# Patient Record
Sex: Female | Born: 1965 | Race: Black or African American | Hispanic: No | Marital: Single | Smoking: Current every day smoker
Health system: Southern US, Community
[De-identification: ages and names within clinical notes are randomized; demographics above are authoritative.]

## PROBLEM LIST (undated history)

## (undated) DIAGNOSIS — I209 Angina pectoris, unspecified: Secondary | ICD-10-CM

## (undated) DIAGNOSIS — R0602 Shortness of breath: Secondary | ICD-10-CM

## (undated) DIAGNOSIS — O2230 Deep phlebothrombosis in pregnancy, unspecified trimester: Secondary | ICD-10-CM

## (undated) DIAGNOSIS — F419 Anxiety disorder, unspecified: Secondary | ICD-10-CM

## (undated) HISTORY — PX: TUBAL LIGATION: SHX77

---

## 2010-10-06 ENCOUNTER — Inpatient Hospital Stay (INDEPENDENT_AMBULATORY_CARE_PROVIDER_SITE_OTHER)
Admission: RE | Admit: 2010-10-06 | Discharge: 2010-10-06 | Disposition: A | Discharge status: Home / Self Care | Payer: Self-pay | Source: Ambulatory Visit | Attending: Emergency Medicine | Admitting: Emergency Medicine

## 2010-10-06 DIAGNOSIS — K029 Dental caries, unspecified: Secondary | ICD-10-CM

## 2010-10-29 ENCOUNTER — Other Ambulatory Visit (HOSPITAL_COMMUNITY): Payer: Self-pay | Admitting: Family Medicine

## 2010-10-29 DIAGNOSIS — Z1231 Encounter for screening mammogram for malignant neoplasm of breast: Secondary | ICD-10-CM

## 2010-10-30 ENCOUNTER — Ambulatory Visit (HOSPITAL_COMMUNITY)
Admission: RE | Admit: 2010-10-30 | Discharge: 2010-10-30 | Disposition: A | Discharge status: Home / Self Care | Payer: Self-pay | Source: Ambulatory Visit | Attending: Family Medicine | Admitting: Family Medicine

## 2010-10-30 DIAGNOSIS — Z1231 Encounter for screening mammogram for malignant neoplasm of breast: Secondary | ICD-10-CM | POA: Insufficient documentation

## 2011-06-05 ENCOUNTER — Emergency Department (HOSPITAL_COMMUNITY): Payer: Self-pay

## 2011-06-05 ENCOUNTER — Encounter: Payer: Self-pay | Admitting: Emergency Medicine

## 2011-06-05 ENCOUNTER — Other Ambulatory Visit: Payer: Self-pay

## 2011-06-05 ENCOUNTER — Inpatient Hospital Stay (HOSPITAL_COMMUNITY)
Admission: EM | Admit: 2011-06-05 | Discharge: 2011-06-08 | DRG: 313 | Disposition: A | Discharge status: Home / Self Care | Payer: Self-pay | Attending: Internal Medicine | Admitting: Internal Medicine

## 2011-06-05 DIAGNOSIS — E876 Hypokalemia: Secondary | ICD-10-CM

## 2011-06-05 DIAGNOSIS — R079 Chest pain, unspecified: Secondary | ICD-10-CM

## 2011-06-05 DIAGNOSIS — R42 Dizziness and giddiness: Secondary | ICD-10-CM

## 2011-06-05 DIAGNOSIS — F17200 Nicotine dependence, unspecified, uncomplicated: Secondary | ICD-10-CM | POA: Diagnosis present

## 2011-06-05 DIAGNOSIS — R0789 Other chest pain: Principal | ICD-10-CM

## 2011-06-05 DIAGNOSIS — R110 Nausea: Secondary | ICD-10-CM

## 2011-06-05 HISTORY — DX: Angina pectoris, unspecified: I20.9

## 2011-06-05 HISTORY — DX: Shortness of breath: R06.02

## 2011-06-05 HISTORY — DX: Anxiety disorder, unspecified: F41.9

## 2011-06-05 HISTORY — DX: Deep phlebothrombosis in pregnancy, unspecified trimester: O22.30

## 2011-06-05 LAB — CBC
HCT: 36.5 % (ref 36.0–46.0)
Hemoglobin: 12.5 g/dL (ref 12.0–15.0)
MCH: 30.4 pg (ref 26.0–34.0)
MCHC: 34.2 g/dL (ref 30.0–36.0)
RDW: 14.1 % (ref 11.5–15.5)

## 2011-06-05 LAB — COMPREHENSIVE METABOLIC PANEL
Albumin: 3.7 g/dL (ref 3.5–5.2)
BUN: 7 mg/dL (ref 6–23)
Creatinine, Ser: 0.69 mg/dL (ref 0.50–1.10)
Total Protein: 7.1 g/dL (ref 6.0–8.3)

## 2011-06-05 LAB — DIFFERENTIAL
Basophils Absolute: 0 10*3/uL (ref 0.0–0.1)
Basophils Relative: 0 % (ref 0–1)
Eosinophils Absolute: 0.1 10*3/uL (ref 0.0–0.7)
Monocytes Absolute: 0.4 10*3/uL (ref 0.1–1.0)
Monocytes Relative: 8 % (ref 3–12)
Neutrophils Relative %: 46 % (ref 43–77)

## 2011-06-05 LAB — POCT I-STAT TROPONIN I: Troponin i, poc: 0 ng/mL (ref 0.00–0.08)

## 2011-06-05 MED ORDER — HYDROMORPHONE HCL PF 1 MG/ML IJ SOLN
1.0000 mg | Freq: Once | INTRAMUSCULAR | Status: AC
  Administered 2011-06-05: 1 mg via INTRAVENOUS
  Filled 2011-06-05: qty 1

## 2011-06-05 MED ORDER — HYDROCODONE-ACETAMINOPHEN 5-325 MG PO TABS: 1.0000 | ORAL_TABLET | Freq: Four times a day (QID) | ORAL | Status: DC | PRN

## 2011-06-05 NOTE — ED Notes (Signed)
Pt states pain has eased up but she is dizzy.  States she hasn't eaten or had anything to drink in 24 hrs.  EDP will see pt shortly-pt aware.  Will ask if ok for pt to eat/drink.

## 2011-06-05 NOTE — ED Notes (Signed)
Patient is resting comfortably. States pain is nearly gone but now feels sleepy and dizzy from medication.  Has call bell. Lights off for comfort.  VSS.

## 2011-06-05 NOTE — ED Notes (Signed)
Patient transported to X-ray 

## 2011-06-05 NOTE — ED Notes (Signed)
Pt c/o chest heaviness.  States feels like someone is sitting on her chest but says it isn't new.  New BP 102/76 taken and monitor showing SR 70's.  Will cont to monitor.

## 2011-06-05 NOTE — ED Provider Notes (Addendum)
History     CSN: [id number] Arrival date & time: 06/05/2011  3:40 PM   First MD Initiated Contact with Patient 06/05/11 1622      Chief Complaint  Patient presents with  . Chest Pain    (Consider location/radiation/quality/duration/timing/severity/associated sxs/prior treatment) Patient is a 45 y.o. female presenting with chest pain. The history is provided by the patient (Patient complains of chest pain worse with movement no nausea no sweating no shortness of shortness of).  Chest Pain The chest pain began 2 days ago. Chest pain occurs frequently. The chest pain is unchanged. At its most intense, the pain is at 3/10. The pain is currently at 2/10. The severity of the pain is moderate. The quality of the pain is described as aching. The pain does not radiate. Pertinent negatives for primary symptoms include no fever, no fatigue, no cough and no abdominal pain.  Pertinent negatives for associated symptoms include no claudication.  Pertinent negatives for past medical history include no seizures.     Past Medical History  Diagnosis Date  . DVT (deep vein thrombosis) in pregnancy     Past Surgical History  Procedure Date  . Tubal ligation     History reviewed. No pertinent family history.  History  Substance Use Topics  . Smoking status: Current Everyday Smoker -- 0.5 packs/day  . Smokeless tobacco: Not on file  . Alcohol Use: Yes     ocasionally    OB History    Grav Para Term Preterm Abortions TAB SAB Ect Mult Living                  Review of Systems  Constitutional: Negative for fever and fatigue.  HENT: Negative for congestion, sinus pressure and ear discharge.   Eyes: Negative for discharge.  Respiratory: Negative for cough.   Cardiovascular: Positive for chest pain. Negative for claudication.  Gastrointestinal: Negative for abdominal pain and diarrhea.  Genitourinary: Negative for frequency and hematuria.  Musculoskeletal: Negative for back pain.  Skin:  Negative for rash.  Neurological: Negative for seizures and headaches.  Hematological: Negative.   Psychiatric/Behavioral: Negative for hallucinations.    Allergies  Review of patient's allergies indicates no known allergies.  Home Medications   Current Outpatient Rx  Name Route Sig Dispense Refill  . IBUPROFEN 200 MG PO TABS Oral Take 400 mg by mouth every 6 (six) hours as needed. For pain.     Marland Kitchen HYDROCODONE-ACETAMINOPHEN 5-325 MG PO TABS Oral Take 1 tablet by mouth every 6 (six) hours as needed for pain. 20 tablet 0    BP 96/67  Pulse 63  Temp(Src) 98.6 F (37 C) (Oral)  Resp 17  SpO2 100%  LMP 05/26/2011  Physical Exam  Constitutional: She is oriented to person, place, and time. She appears well-developed.  HENT:  Head: Normocephalic and atraumatic.  Eyes: Conjunctivae and EOM are normal. No scleral icterus.  Neck: Neck supple. No thyromegaly present.  Cardiovascular: Normal rate and regular rhythm.  Exam reveals no gallop and no friction rub.   No murmur heard. Pulmonary/Chest: No stridor. She has no wheezes. She has no rales. She exhibits no tenderness.       tendernous over sternum  Abdominal: She exhibits no distension. There is no tenderness. There is no rebound.  Musculoskeletal: Normal range of motion. She exhibits no edema.  Lymphadenopathy:    She has no cervical adenopathy.  Neurological: She is oriented to person, place, and time. Coordination normal.  Skin: No rash noted.  No erythema.  Psychiatric: She has a normal mood and affect. Her behavior is normal.    ED Course  Procedures (including critical care time)   Labs Reviewed  CBC  DIFFERENTIAL  COMPREHENSIVE METABOLIC PANEL  POCT I-STAT TROPONIN I  I-STAT TROPONIN I   Dg Chest 2 View  06/05/2011  *RADIOLOGY REPORT*  Clinical Data: Shortness of breath and chest pain, smoker  CHEST - 2 VIEW  Comparison:  None.  Findings:  The heart size and mediastinal contours are within normal limits.  Both  lungs are clear.  The visualized skeletal structures are unremarkable.  IMPRESSION: No active cardiopulmonary disease.  Original Report Authenticated By: Harrel Lemon, M.D.     1. Chest wall pain    Results for orders placed during the hospital encounter of 06/05/11  CBC      Component Value Range   WBC 4.9  4.0 - 10.5 (K/uL)   RBC 4.11  3.87 - 5.11 (MIL/uL)   Hemoglobin 12.5  12.0 - 15.0 (g/dL)   HCT 14.7  82.9 - 56.2 (%)   MCV 88.8  78.0 - 100.0 (fL)   MCH 30.4  26.0 - 34.0 (pg)   MCHC 34.2  30.0 - 36.0 (g/dL)   RDW 13.0  86.5 - 78.4 (%)   Platelets 260  150 - 400 (K/uL)  DIFFERENTIAL      Component Value Range   Neutrophils Relative 46  43 - 77 (%)   Neutro Abs 2.3  1.7 - 7.7 (K/uL)   Lymphocytes Relative 43  12 - 46 (%)   Lymphs Abs 2.1  0.7 - 4.0 (K/uL)   Monocytes Relative 8  3 - 12 (%)   Monocytes Absolute 0.4  0.1 - 1.0 (K/uL)   Eosinophils Relative 3  0 - 5 (%)   Eosinophils Absolute 0.1  0.0 - 0.7 (K/uL)   Basophils Relative 0  0 - 1 (%)   Basophils Absolute 0.0  0.0 - 0.1 (K/uL)  COMPREHENSIVE METABOLIC PANEL      Component Value Range   Sodium 137  135 - 145 (mEq/L)   Potassium 3.9  3.5 - 5.1 (mEq/L)   Chloride 102  96 - 112 (mEq/L)   CO2 28  19 - 32 (mEq/L)   Glucose, Bld 87  70 - 99 (mg/dL)   BUN 7  6 - 23 (mg/dL)   Creatinine, Ser 6.96  0.50 - 1.10 (mg/dL)   Calcium 9.2  8.4 - 29.5 (mg/dL)   Total Protein 7.1  6.0 - 8.3 (g/dL)   Albumin 3.7  3.5 - 5.2 (g/dL)   AST 15  0 - 37 (U/L)   ALT 9  0 - 35 (U/L)   Alkaline Phosphatase 73  39 - 117 (U/L)   Total Bilirubin 0.3  0.3 - 1.2 (mg/dL)   GFR calc non Af Amer >90  >90 (mL/min)   GFR calc Af Amer >90  >90 (mL/min)  POCT I-STAT TROPONIN I      Component Value Range   Troponin i, poc 0.00  0.00 - 0.08 (ng/mL)   Comment 3             Date: 06/05/2011  Rate55  Rhythm: sinus bradycardia  QRS Axis: normal  Intervals: normal  ST/T Wave abnormalities: normal  Conduction Disutrbances:none  Narrative  Interpretation:   Old EKG Reviewed: none available  Results for orders placed during the hospital encounter of 06/05/11  CBC      Component Value Range  WBC 4.9  4.0 - 10.5 (K/uL)   RBC 4.11  3.87 - 5.11 (MIL/uL)   Hemoglobin 12.5  12.0 - 15.0 (g/dL)   HCT 40.9  81.1 - 91.4 (%)   MCV 88.8  78.0 - 100.0 (fL)   MCH 30.4  26.0 - 34.0 (pg)   MCHC 34.2  30.0 - 36.0 (g/dL)   RDW 78.2  95.6 - 21.3 (%)   Platelets 260  150 - 400 (K/uL)  DIFFERENTIAL      Component Value Range   Neutrophils Relative 46  43 - 77 (%)   Neutro Abs 2.3  1.7 - 7.7 (K/uL)   Lymphocytes Relative 43  12 - 46 (%)   Lymphs Abs 2.1  0.7 - 4.0 (K/uL)   Monocytes Relative 8  3 - 12 (%)   Monocytes Absolute 0.4  0.1 - 1.0 (K/uL)   Eosinophils Relative 3  0 - 5 (%)   Eosinophils Absolute 0.1  0.0 - 0.7 (K/uL)   Basophils Relative 0  0 - 1 (%)   Basophils Absolute 0.0  0.0 - 0.1 (K/uL)  COMPREHENSIVE METABOLIC PANEL      Component Value Range   Sodium 137  135 - 145 (mEq/L)   Potassium 3.9  3.5 - 5.1 (mEq/L)   Chloride 102  96 - 112 (mEq/L)   CO2 28  19 - 32 (mEq/L)   Glucose, Bld 87  70 - 99 (mg/dL)   BUN 7  6 - 23 (mg/dL)   Creatinine, Ser 0.86  0.50 - 1.10 (mg/dL)   Calcium 9.2  8.4 - 57.8 (mg/dL)   Total Protein 7.1  6.0 - 8.3 (g/dL)   Albumin 3.7  3.5 - 5.2 (g/dL)   AST 15  0 - 37 (U/L)   ALT 9  0 - 35 (U/L)   Alkaline Phosphatase 73  39 - 117 (U/L)   Total Bilirubin 0.3  0.3 - 1.2 (mg/dL)   GFR calc non Af Amer >90  >90 (mL/min)   GFR calc Af Amer >90  >90 (mL/min)  POCT I-STAT TROPONIN I      Component Value Range   Troponin i, poc 0.00  0.00 - 0.08 (ng/mL)   Comment 3            Dg Chest 2 View  06/05/2011  *RADIOLOGY REPORT*  Clinical Data: Shortness of breath and chest pain, smoker  CHEST - 2 VIEW  Comparison:  None.  Findings:  The heart size and mediastinal contours are within normal limits.  Both lungs are clear.  The visualized skeletal structures are unremarkable.  IMPRESSION: No active  cardiopulmonary disease.  Original Report Authenticated By: Harrel Lemon, M.D.       MDM  Pt with chest wall pain,  Normal troponin and ekg    Pt did not want to go home with the chest wall pain,  She was admitted for further work up    Benny Lennert, MD 06/05/11 2007  Benny Lennert, MD 06/06/11 [id number]

## 2011-06-05 NOTE — ED Notes (Signed)
Pt given sandwich and apple juice as requested and ok by EDP.  Pt otherwise in nad.

## 2011-06-05 NOTE — ED Notes (Signed)
Pt reports having left sided chest pain that feels like a "vice" intermittently started x2 days ago sometimes radiating into left shoulder and neck. Reports she did have some N/V and sob, but not at this time.

## 2011-06-05 NOTE — ED Notes (Signed)
Pt presenting to ed with c/o chest pain x 2 days with shortness of breath and nausea no vomiting. Pt states pain is intermittent. Pt is alert and oriented at this time.

## 2011-06-05 NOTE — ED Notes (Signed)
Phlebotomy notified of need for istat troponin

## 2011-06-05 NOTE — ED Notes (Signed)
Pt medicated as ordered.  Per EDP, pt not up for d/c yet.  Will reassess after pain meds for the disposition.

## 2011-06-06 ENCOUNTER — Observation Stay (HOSPITAL_COMMUNITY): Payer: Self-pay

## 2011-06-06 ENCOUNTER — Encounter (HOSPITAL_COMMUNITY): Payer: Self-pay | Admitting: *Deleted

## 2011-06-06 ENCOUNTER — Other Ambulatory Visit: Payer: Self-pay

## 2011-06-06 DIAGNOSIS — R079 Chest pain, unspecified: Secondary | ICD-10-CM | POA: Diagnosis present

## 2011-06-06 DIAGNOSIS — E876 Hypokalemia: Secondary | ICD-10-CM | POA: Diagnosis not present

## 2011-06-06 DIAGNOSIS — R110 Nausea: Secondary | ICD-10-CM | POA: Diagnosis present

## 2011-06-06 LAB — CARDIAC PANEL(CRET KIN+CKTOT+MB+TROPI)
CK, MB: 5.2 ng/mL — ABNORMAL HIGH (ref 0.3–4.0)
CK, MB: 4.8 ng/mL — ABNORMAL HIGH (ref 0.3–4.0)
Total CK: 133 U/L (ref 7–177)
Troponin I: <0.3 ng/mL (ref <0.30)

## 2011-06-06 LAB — CBC
HCT: 34.8 % — ABNORMAL LOW (ref 36.0–46.0)
Hemoglobin: 11.5 g/dL — ABNORMAL LOW (ref 12.0–15.0)
RBC: 3.9 MIL/uL (ref 3.87–5.11)
RDW: 13.9 % (ref 11.5–15.5)
WBC: 4.8 10*3/uL (ref 4.0–10.5)

## 2011-06-06 LAB — BASIC METABOLIC PANEL
Chloride: 106 mEq/L (ref 96–112)
GFR calc Af Amer: >90 mL/min (ref >90)
Potassium: 3.4 mEq/L — ABNORMAL LOW (ref 3.5–5.1)
Sodium: 138 mEq/L (ref 135–145)

## 2011-06-06 MED ORDER — ENOXAPARIN SODIUM 40 MG/0.4ML ~~LOC~~ SOLN
40.0000 mg | SUBCUTANEOUS | Status: DC
  Administered 2011-06-06 – 2011-06-08 (×3): 40 mg via SUBCUTANEOUS
  Filled 2011-06-06 (×3): qty 0.4

## 2011-06-06 MED ORDER — ASPIRIN EC 325 MG PO TBEC
325.0000 mg | DELAYED_RELEASE_TABLET | Freq: Every day | ORAL | Status: DC
  Administered 2011-06-06 – 2011-06-08 (×3): 325 mg via ORAL
  Filled 2011-06-06 (×3): qty 1

## 2011-06-06 MED ORDER — PANTOPRAZOLE SODIUM 40 MG PO TBEC
40.0000 mg | DELAYED_RELEASE_TABLET | Freq: Every day | ORAL | Status: DC
  Administered 2011-06-06 – 2011-06-08 (×3): 40 mg via ORAL
  Filled 2011-06-06 (×4): qty 1

## 2011-06-06 MED ORDER — IOHEXOL 300 MG/ML  SOLN
100.0000 mL | Freq: Once | INTRAMUSCULAR | Status: AC | PRN
  Administered 2011-06-06: 100 mL via INTRAVENOUS

## 2011-06-06 MED ORDER — SODIUM CHLORIDE 0.9 % IJ SOLN
3.0000 mL | Freq: Two times a day (BID) | INTRAMUSCULAR | Status: DC
  Administered 2011-06-06 – 2011-06-07 (×4): 3 mL via INTRAVENOUS

## 2011-06-06 MED ORDER — MORPHINE SULFATE 2 MG/ML IJ SOLN: 2.0000 mg | INTRAMUSCULAR | Status: DC | PRN

## 2011-06-06 MED ORDER — POTASSIUM CHLORIDE CRYS ER 20 MEQ PO TBCR
40.0000 meq | EXTENDED_RELEASE_TABLET | Freq: Once | ORAL | Status: AC
  Administered 2011-06-06: 40 meq via ORAL
  Filled 2011-06-06: qty 2

## 2011-06-06 MED ORDER — SODIUM CHLORIDE 0.9 % IJ SOLN: 3.0000 mL | INTRAMUSCULAR | Status: DC | PRN

## 2011-06-06 MED ORDER — SODIUM CHLORIDE 0.9 % IV SOLN: 250.0000 mL | INTRAVENOUS | Status: DC

## 2011-06-06 MED ORDER — LORAZEPAM 1 MG PO TABS: 1.0000 mg | ORAL_TABLET | Freq: Three times a day (TID) | ORAL | Status: DC | PRN

## 2011-06-06 MED ORDER — ALUM & MAG HYDROXIDE-SIMETH 200-200-20 MG/5ML PO SUSP: 30.0000 mL | Freq: Four times a day (QID) | ORAL | Status: DC | PRN

## 2011-06-06 NOTE — Consult Note (Signed)
CARDIOLOGY CONSULT NOTE  Patient ID: Cynthia Carr MRN: [id number] DOB/AGE: Nov 24, 1965 45 y.o.  Admit date: 06/05/2011 Referring Physician Dr. Olena Leatherwood Primary Physician N/A Primary CardiologistN/A Reason for Consultation chest pain  HPI: Cynthia Carr is a 45 year old African American female who is seen at the request of the hospitalist service for evaluation of chest pain. She has previously been healthy. She reports that since Wednesday of this week she has experienced symptoms of chest pain. This pain began at rest. She describes it as her entire upper body being in a vice. There is an occasional sharp component and squeezing component. It is relieved by lying down in a quiet position and regulating her breathing. It is worse with certain positions. It is not consistently worse with exertion. She has associated nausea and dizziness. She complains of tingling in her hands. Pain may last anywhere from 5 minutes to 2 hours. There are no clear alleviating factors. She did receive an injection of IV Dilaudid and reports that this relieved her pain for several hours. She's never had this pain previously. She has no history of hypertension, hyperlipidemia, or diabetes. She does smoke 5-6 cigarettes per day. There is no family history of premature coronary disease. She is usually active and has noticed no difficulty with walking previously.  Review of systems complete and found to be negative unless listed above   Past Medical History  Diagnosis Date  . DVT (deep vein thrombosis) in pregnancy   . Angina   . Shortness of breath   . Anxiety     History reviewed. No pertinent family history.  History   Social History  . Marital Status: Single    Spouse Name: N/A    Number of Children: N/A  . Years of Education: N/A   Occupational History  . Not on file.   Social History Main Topics  . Smoking status: Current Everyday Smoker -- 0.5 packs/day for 20 years  . Smokeless tobacco: Not on file  .  Alcohol Use: Yes     ocasionally  . Drug Use: No  . Sexually Active: Not Currently   Other Topics Concern  . Not on file   Social History Narrative  . No narrative on file    Past Surgical History  Procedure Date  . Tubal ligation      Prescriptions prior to admission  Medication Sig Dispense Refill  . ibuprofen (ADVIL,MOTRIN) 200 MG tablet Take 400 mg by mouth every 6 (six) hours as needed. For pain.         Physical Exam: Blood pressure 97/64, pulse 59, temperature 98.2 F (36.8 C), temperature source Oral, resp. rate 16, height 5\' 3"  (1.6 m), weight 86.5 kg (190 lb 11.2 oz), last menstrual period 05/26/2011, SpO2 100.00%.   She is a pleasant, overweight, black female in no acute distress. She does to appear to be somewhat uncomfortable.The patient is alert and oriented x 3.  The mood and affect are normal.  The skin is warm and dry.  Color is normal.  The HEENT exam reveals that the sclera are nonicteric.  The mucous membranes are moist.  The carotids are 2+ without bruits.  There is no thyromegaly.  There is no JVD.  The lungs are clear.  The chest wall demonstrates mild tenderness to palpation in the left parasternal region. The heart exam reveals a regular rate with a normal S1 and S2.  There are no murmurs, gallops, or rubs.  The PMI is not displaced.   Abdominal  exam reveals good bowel sounds.  There is no guarding or rebound.  There is no hepatosplenomegaly or tenderness.  There are no masses.  Exam of the legs reveal no clubbing, cyanosis, or edema.  The legs are without rashes.  The distal pulses are intact.  Cranial nerves II - XII are intact.  Motor and sensory functions are intact.  The gait is normal.  Labs:   Lab Results  Component Value Date   WBC 4.8 06/06/2011   HGB 11.5* 06/06/2011   HCT 34.8* 06/06/2011   MCV 89.2 06/06/2011   PLT 239 06/06/2011     Lab 06/06/11 0455 06/05/11 1635  NA 138 --  K 3.4* --  CL 106 --  CO2 26 --  BUN 8 --  CREATININE 0.73 --   CALCIUM 8.8 --  PROT -- 7.1  BILITOT -- 0.3  ALKPHOS -- 73  ALT -- 9  AST -- 15  GLUCOSE 100* --   Lab Results  Component Value Date   CKTOTAL 140 06/06/2011   CKMB 5.2* 06/06/2011   TROPONINI <0.30 06/06/2011    No results found for this basename: CHOL   No results found for this basename: HDL   No results found for this basename: LDLCALC   No results found for this basename: TRIG   No results found for this basename: CHOLHDL   No results found for this basename: LDLDIRECT      Radiology: Chest x-ray shows no active disease. Abdominal ultrasound is normal. EKG: On 06/05/2011 was normal. It has not been repeated. Point-of-care troponin in the emergency department was 0.  ASSESSMENT AND PLAN:   1. Atypical chest pain. Patient's only risk factor is history of tobacco use. Exam and ECG are unremarkable. Pain relieved with narcotics. 2. Tobacco abuse.  Plan: We'll repeat ECG today. We'll cycle cardiac enzymes. If enzymes are negative I think the patient can be safely discharged home with plans for outpatient stress testing. I recommend smoking cessation.  Signed: Clyde Zarrella [place] 06/06/2011, 12:21 PM

## 2011-06-06 NOTE — Plan of Care (Signed)
Problem: Consults Goal: Tobacco Cessation referral if indicated Outcome: Completed/Met Date Met:  06/06/11 Pt refuses- states does not want to quit and only smoke 1/2 PPD

## 2011-06-06 NOTE — Progress Notes (Signed)
Subjective: PT  STATES THAT HER CHEST PAIN GETS WORST WITH ANY ACTIVITY .   Objective: Vital signs in last 24 hours: Temp:  [97.6 F (36.4 C)-98.6 F (37 C)] 98.2 F (36.8 C) (11/17 0500) Pulse Rate:  [59-73] 59  (11/17 0500) Resp:  [11-20] 16  (11/17 0500) BP: (96-115)/(63-77) 97/64 mmHg (11/17 0500) SpO2:  [100 %] 100 % (11/17 0500) Weight:  [86.5 kg (190 lb 11.2 oz)] 190 lb 11.2 oz (86.5 kg) (11/17 0152) Weight change:   -Intake/Output from previous day:    Blood pressure 97/64, pulse 59, temperature 98.2 F (36.8 C), temperature source Oral, resp. rate 16, height 5\' 3"  (1.6 m), weight 86.5 kg (190 lb 11.2 oz), last menstrual period 05/26/2011, SpO2 100.00%. GEN: Appears her stated age CV: RRR Lungs: CTA bilaterally Ext: No edema   Lab Results: Lab Results  Component Value Date   WBC 4.8 06/06/2011   HGB 11.5* 06/06/2011   HCT 34.8* 06/06/2011   MCV 89.2 06/06/2011   PLT 239 06/06/2011    BMET  Lab 06/06/11 0455  NA 138  K 3.4*  CL 106  CO2 26  BUN 8  CREATININE 0.73  LABGLOM --  GLUCOSE 100*  CALCIUM 8.8    Studies/Results: Dg Chest 2 View  06/05/2011  *RADIOLOGY REPORT*  Clinical Data: Shortness of breath and chest pain, smoker  CHEST - 2 VIEW  Comparison:  None.  Findings:  The heart size and mediastinal contours are within normal limits.  Both lungs are clear.  The visualized skeletal structures are unremarkable.  IMPRESSION: No active cardiopulmonary disease.  Original Report Authenticated By: Harrel Lemon, M.D.   US Abdomen Complete  06/06/2011  *RADIOLOGY REPORT*  Clinical Data:  Chest pain, nausea, evaluate gallbladder  COMPLETE ABDOMINAL ULTRASOUND  Comparison:  None.  Findings:  Gallbladder:  No gallstones, gallbladder wall thickening, or pericholecystic fluid.  Negative sonographic Murphy's sign.  Common bile duct:  Measures 3.5 mm.  Liver:  No focal lesion identified.  Within normal limits in parenchymal echogenicity.  IVC:  Appears normal.   Pancreas:  Incompletely visualized but grossly unremarkable.  Spleen:  Measures 4.8 cm.  Right Kidney:  Measures 11.3 cm.  No mass or hydronephrosis.  Left Kidney:  Measures 11.7 cm.  No mass or hydronephrosis.  Abdominal aorta:  No aneurysm identified.  IMPRESSION: Negative abdominal ultrasound.  Original Report Authenticated By: Charline Bills, M.D.    Medications:  Current Facility-Administered Medications  Medication Dose Route Frequency Provider Last Rate Last Dose  . 0.9 %  sodium chloride infusion  250 mL Intravenous Continuous Houston Siren      . alum & mag hydroxide-simeth (MAALOX/MYLANTA) 200-200-20 MG/5ML suspension 30 mL  30 mL Oral Q6H PRN Houston Siren      . aspirin EC tablet 325 mg  325 mg Oral Daily Peter Le   325 mg at 06/06/11 1047  . enoxaparin (LOVENOX) injection 40 mg  40 mg Subcutaneous Q24H Peter Le   40 mg at 06/06/11 1048  . HYDROmorphone (DILAUDID) injection 1 mg  1 mg Intravenous Once Benny Lennert, MD   1 mg at 06/05/11 2049  . LORazepam (ATIVAN) tablet 1 mg  1 mg Oral Q8H PRN Houston Siren      . morphine 2 MG/ML injection 2 mg  2 mg Intravenous Q2H PRN Houston Siren      . pantoprazole (PROTONIX) EC tablet 40 mg  40 mg Oral Q1200 Sydnei Ohaver Jarrett-Davis      .  potassium chloride SA (K-DUR,KLOR-CON) CR tablet 40 mEq  40 mEq Oral Once Nastacia Raybuck Jarrett-Davis      . sodium chloride 0.9 % injection 3 mL  3 mL Intravenous Q12H Peter Le   3 mL at 06/06/11 1050  . sodium chloride 0.9 % injection 3 mL  3 mL Intravenous PRN Houston Siren        Assessment/Plan: Chest pain Nausea alone Hypokalemia  Pt state now that her chest Pain gets worst with activity and last for 5 min to 2 hours.  She also states that her chest wall is tender.  Will ask Cardiology to evaluate to see if she needs a inpatient vs outpatient stress test for Atypical Chest Pain.  Replete her Potassium, check D-Dimer and Fasting Lipid Panel..   LOS: 1 day   Earlene Plater MD, [place] 06/06/2011, 11:16 AM

## 2011-06-06 NOTE — H&P (Signed)
PCP:   Georganna Skeans, MD, MD   Chief Complaint: Intermittent chest pain for 2 days   HPI: Cynthia Carr is an 45 y.o. female with benign past medical history on no chronic medication presents to [facility] emergency room complaints of intermittent chest pain for the past 2 days.   She clearly states that her pain is not exertional and nonradiating.  She did experience some paresthesia of both arms. She also experienced some nausea but no vomiting. There has been no fever, chills, cough, headaches, or any other symptomology. She does not think that her discomfort and nausea is related to eating.  Rewiew of Systems:  The patient denies anorexia, fever, weight loss,, vision loss, decreased hearing, hoarseness, syncope, dyspnea on exertion, peripheral edema, balance deficits, hemoptysis, abdominal pain, melena, hematochezia, severe indigestion/heartburn, hematuria, incontinence, genital sores, muscle weakness, suspicious skin lesions, transient blindness, difficulty walking, depression, unusual weight change, abnormal bleeding, enlarged lymph nodes, angioedema, and breast masses.    Past Medical History  Diagnosis Date  . DVT (deep vein thrombosis) in pregnancy     Past Surgical History  Procedure Date  . Tubal ligation     Medications:  HOME MEDS: Prior to Admission medications   Medication Sig Start Date End Date Taking? Authorizing Provider  ibuprofen (ADVIL,MOTRIN) 200 MG tablet Take 400 mg by mouth every 6 (six) hours as needed. For pain.    Yes Historical Provider, MD  HYDROcodone-acetaminophen (NORCO) 5-325 MG per tablet Take 1 tablet by mouth every 6 (six) hours as needed for pain. 06/05/11 06/15/11  Benny Lennert, MD     Allergies:  No Known Allergies  Social History:   reports that she has been smoking.  She does not have any smokeless tobacco history on file. She reports that she drinks alcohol. She reports that she does not use illicit drugs.  Family  History: History reviewed. No pertinent family history.   Physical Exam: Filed Vitals:   06/05/11 1653 06/05/11 1750 06/05/11 2134 06/06/11 0152  BP: 114/74 96/67 102/63 115/77  Pulse:  63 69 71  Temp:  98.6 F (37 C)  97.6 F (36.4 C)  TempSrc:  Oral  Oral  Resp: 11 17  16   Height:    5\' 3"  (1.6 m)  Weight:    86.5 kg (190 lb 11.2 oz)  SpO2: 100% 100% 100% 100%   Blood pressure 115/77, pulse 71, temperature 97.6 F (36.4 C), temperature source Oral, resp. rate 16, height 5\' 3"  (1.6 m), weight 86.5 kg (190 lb 11.2 oz), last menstrual period 05/26/2011, SpO2 100.00%.  GEN:  Pleasant  person lying in the stretcher in no acute distress; cooperative with exam PSYCH:  alert and oriented x4; does not appear anxious does not appear depressed; affect is normal HEENT: Mucous membranes pink and anicteric; PERRLA; EOM intact; no cervical lymphadenopathy nor thyromegaly or carotid bruit; no JVD; Breasts:: Not examined CHEST WALL: No tenderness CHEST: Normal respiration, clear to auscultation bilaterally HEART: Regular rate and rhythm; no murmurs rubs or gallops BACK: No kyphosis or scoliosis; no CVA tenderness ABDOMEN: Obese, soft non-tender; no masses, no organomegaly, normal abdominal bowel sounds; no pannus; no intertriginous candida. Rectal Exam: Not done EXTREMITIES: No bone or joint deformity; age-appropriate arthropathy of the hands and knees; no edema; no ulcerations. Genitalia: not examined PULSES: 2+ and symmetric SKIN: Normal hydration no rash or ulceration CNS: Cranial nerves 2-12 grossly intact no focal neurologic deficit   Labs & Imaging Results for orders placed during the hospital  encounter of 06/05/11 (from the past 48 hour(s))  CBC     Status: Normal   Collection Time   06/05/11  4:35 PM      Component Value Range Comment   WBC 4.9  4.0 - 10.5 (K/uL)    RBC 4.11  3.87 - 5.11 (MIL/uL)    Hemoglobin 12.5  12.0 - 15.0 (g/dL)    HCT 13.0  86.5 - 78.4 (%)    MCV 88.8   78.0 - 100.0 (fL)    MCH 30.4  26.0 - 34.0 (pg)    MCHC 34.2  30.0 - 36.0 (g/dL)    RDW 69.6  29.5 - 28.4 (%)    Platelets 260  150 - 400 (K/uL)   DIFFERENTIAL     Status: Normal   Collection Time   06/05/11  4:35 PM      Component Value Range Comment   Neutrophils Relative 46  43 - 77 (%)    Neutro Abs 2.3  1.7 - 7.7 (K/uL)    Lymphocytes Relative 43  12 - 46 (%)    Lymphs Abs 2.1  0.7 - 4.0 (K/uL)    Monocytes Relative 8  3 - 12 (%)    Monocytes Absolute 0.4  0.1 - 1.0 (K/uL)    Eosinophils Relative 3  0 - 5 (%)    Eosinophils Absolute 0.1  0.0 - 0.7 (K/uL)    Basophils Relative 0  0 - 1 (%)    Basophils Absolute 0.0  0.0 - 0.1 (K/uL)   COMPREHENSIVE METABOLIC PANEL     Status: Normal   Collection Time   06/05/11  4:35 PM      Component Value Range Comment   Sodium 137  135 - 145 (mEq/L)    Potassium 3.9  3.5 - 5.1 (mEq/L)    Chloride 102  96 - 112 (mEq/L)    CO2 28  19 - 32 (mEq/L)    Glucose, Bld 87  70 - 99 (mg/dL)    BUN 7  6 - 23 (mg/dL)    Creatinine, Ser 1.32  0.50 - 1.10 (mg/dL)    Calcium 9.2  8.4 - 10.5 (mg/dL)    Total Protein 7.1  6.0 - 8.3 (g/dL)    Albumin 3.7  3.5 - 5.2 (g/dL)    AST 15  0 - 37 (U/L)    ALT 9  0 - 35 (U/L)    Alkaline Phosphatase 73  39 - 117 (U/L)    Total Bilirubin 0.3  0.3 - 1.2 (mg/dL)    GFR calc non Af Amer >90  >90 (mL/min)    GFR calc Af Amer >90  >90 (mL/min)   POCT I-STAT TROPONIN I     Status: Normal   Collection Time   06/05/11  6:11 PM      Component Value Range Comment   Troponin i, poc 0.00  0.00 - 0.08 (ng/mL)    Comment 3             Dg Chest 2 View  06/05/2011  *RADIOLOGY REPORT*  Clinical Data: Shortness of breath and chest pain, smoker  CHEST - 2 VIEW  Comparison:  None.  Findings:  The heart size and mediastinal contours are within normal limits.  Both lungs are clear.  The visualized skeletal structures are unremarkable.  IMPRESSION: No active cardiopulmonary disease.  Original Report Authenticated By: Harrel Lemon, M.D.      Assessment Present on Admission:  .Chest pain at  rest .Nausea alone   PLAN:  Will admit this patient for rule out although I do not think that this pain is cardiac in its etiology.  2 May she does appear to be anxious. Will obtain serial CPKs and troponins. If available, I will obtain an echocardiogram to evaluate her LV function. The other possibility is gallstone causing symptoms. Will get an abdominal ultrasound just to be complete. I will put her on an aspirin a day and stop her nonsteroidal anti-inflammatory drugs.  Originally, the emergency room physician was going to discharge her home but she did not feel comfortable going home. Other plans as per orders.    Sten Dematteo 06/06/2011, 1:58 AM

## 2011-06-07 ENCOUNTER — Observation Stay (HOSPITAL_COMMUNITY): Payer: Self-pay

## 2011-06-07 ENCOUNTER — Other Ambulatory Visit: Payer: Self-pay

## 2011-06-07 DIAGNOSIS — R42 Dizziness and giddiness: Secondary | ICD-10-CM | POA: Diagnosis present

## 2011-06-07 DIAGNOSIS — R072 Precordial pain: Secondary | ICD-10-CM

## 2011-06-07 LAB — BASIC METABOLIC PANEL
BUN: 8 mg/dL (ref 6–23)
CO2: 25 mEq/L (ref 19–32)
Calcium: 8.6 mg/dL (ref 8.4–10.5)
GFR calc non Af Amer: >90 mL/min (ref >90)
Glucose, Bld: 97 mg/dL (ref 70–99)

## 2011-06-07 LAB — RAPID URINE DRUG SCREEN, HOSP PERFORMED
Barbiturates: NOT DETECTED
Benzodiazepines: NOT DETECTED
Cocaine: NOT DETECTED
Opiates: NOT DETECTED

## 2011-06-07 LAB — CARDIAC PANEL(CRET KIN+CKTOT+MB+TROPI)
CK, MB: 3.9 ng/mL (ref 0.3–4.0)
Total CK: 120 U/L (ref 7–177)
Troponin I: <0.3 ng/mL (ref <0.30)

## 2011-06-07 LAB — CBC
Hemoglobin: 12 g/dL (ref 12.0–15.0)
MCH: 30.3 pg (ref 26.0–34.0)
MCHC: 34.1 g/dL (ref 30.0–36.0)
MCV: 88.9 fL (ref 78.0–100.0)
Platelets: 237 10*3/uL (ref 150–400)

## 2011-06-07 LAB — LIPID PANEL
Cholesterol: 172 mg/dL (ref 0–200)
Total CHOL/HDL Ratio: 2.3 RATIO
VLDL: 9 mg/dL (ref 0–40)

## 2011-06-07 MED ORDER — ASPIRIN EC 81 MG PO TBEC: 81.0000 mg | DELAYED_RELEASE_TABLET | Freq: Every day | ORAL | Status: AC

## 2011-06-07 MED ORDER — MECLIZINE HCL 25 MG PO TABS
25.0000 mg | ORAL_TABLET | Freq: Three times a day (TID) | ORAL | Status: DC
  Administered 2011-06-07 – 2011-06-08 (×4): 25 mg via ORAL
  Filled 2011-06-07 (×6): qty 1

## 2011-06-07 NOTE — Progress Notes (Signed)
*  PRELIMINARY RESULTS* Echocardiogram 2D Echocardiogram has been performed.  Cynthia Carr 06/07/2011, 12:13 PM

## 2011-06-07 NOTE — Progress Notes (Signed)
Requested to ambulate in halls without assist. Instructed to notify staff to assist if experiencing any dizziness at the time.  Instructed not to be OOB if feeling  Dizzy.  Pt was seen ambulating to nurses station without assist and with steady gait.

## 2011-06-07 NOTE — Progress Notes (Signed)
Subjective: Patient complains that she feels dizzy at times and tingling in her fingers. She stated that been her main complaint for coming to the emergency room and not chest pain.  He complains of  dizziness and feeling as if she is falling and tingling in her fingers.   Objective: Vital signs in last 24 hours: Temp:  [98.2 F (36.8 C)-98.4 F (36.9 C)] 98.2 F (36.8 C) (11/18 0421) Pulse Rate:  [60-85] 60  (11/18 0421) Resp:  [18] 18  (11/18 0421) BP: (90-94)/(58-60) 90/58 mmHg (11/18 0421) SpO2:  [96 %-98 %] 96 % (11/18 0421) Weight:  [85.333 kg (188 lb 2 oz)] 188 lb 2 oz (85.333 kg) (11/18 0500) Weight change: -1.167 kg (-2 lb 9.2 oz)  -Intake/Output from previous day: 11/17 0701 - 11/18 0700 In: 843 [P.O.:840; I.V.:3] Out: 1575 [Urine:1575]  Blood pressure 90/58, pulse 60, temperature 98.2 F (36.8 C), temperature source Oral, resp. rate 18, height 5\' 3"  (1.6 m), weight 85.333 kg (188 lb 2 oz), last menstrual period 05/26/2011, SpO2 96.00%. GEN: Appears her stated age CV: RRR Lungs: CTA bilaterally Ext: No edema   Lab Results: Lab Results  Component Value Date   WBC 3.5* 06/07/2011   HGB 12.0 06/07/2011   HCT 35.2* 06/07/2011   MCV 88.9 06/07/2011   PLT 237 06/07/2011    BMET  Lab 06/07/11 0611  NA 135  K 4.2  CL 103  CO2 25  BUN 8  CREATININE 0.67  LABGLOM --  GLUCOSE 97  CALCIUM 8.6    Studies/Results: Dg Chest 2 View  06/05/2011  *RADIOLOGY REPORT*  Clinical Data: Shortness of breath and chest pain, smoker  CHEST - 2 VIEW  Comparison:  None.  Findings:  The heart size and mediastinal contours are within normal limits.  Both lungs are clear.  The visualized skeletal structures are unremarkable.  IMPRESSION: No active cardiopulmonary disease.  Original Report Authenticated By: Harrel Lemon, M.D.   Ct Angio Chest W/cm &/or Wo Cm  06/06/2011  *RADIOLOGY REPORT*  Clinical Data:  Shortness of breath.  Chest pain.  CT ANGIOGRAPHY CHEST WITH CONTRAST   Technique:  Multidetector CT imaging of the chest was performed using the standard protocol during bolus administration of intravenous contrast.  Multiplanar CT image reconstructions including MIPs were obtained to evaluate the vascular anatomy.  Contrast: OMNIPAQUE IOHEXOL 300 MG/ML IV SOLN  Comparison:  None  Findings:  Satisfactory opacification of the pulmonary arteries noted, and there is no evidence of pulmonary emboli.  No evidence of thoracic aortic aneurysm or dissection.  No evidence of mediastinal or hilar masses.  No adenopathy seen elsewhere within the thorax.  No evidence of pleural or pericardial effusion.  Both lungs are clear.  No evidence of infiltrative mass.  No endobronchial lesion identified.  Review of the MIP images confirms the above findings.  IMPRESSION: Negative.  No evidence of pulmonary embolism or other active disease.  Original Report Authenticated By: Danae Orleans, M.D.   US Abdomen Complete  06/06/2011  *RADIOLOGY REPORT*  Clinical Data:  Chest pain, nausea, evaluate gallbladder  COMPLETE ABDOMINAL ULTRASOUND  Comparison:  None.  Findings:  Gallbladder:  No gallstones, gallbladder wall thickening, or pericholecystic fluid.  Negative sonographic Murphy's sign.  Common bile duct:  Measures 3.5 mm.  Liver:  No focal lesion identified.  Within normal limits in parenchymal echogenicity.  IVC:  Appears normal.  Pancreas:  Incompletely visualized but grossly unremarkable.  Spleen:  Measures 4.8 cm.  Right  Kidney:  Measures 11.3 cm.  No mass or hydronephrosis.  Left Kidney:  Measures 11.7 cm.  No mass or hydronephrosis.  Abdominal aorta:  No aneurysm identified.  IMPRESSION: Negative abdominal ultrasound.  Original Report Authenticated By: Charline Bills, M.D.    Medications:  Current Facility-Administered Medications  Medication Dose Route Frequency Provider Last Rate Last Dose  . 0.9 %  sodium chloride infusion  250 mL Intravenous Continuous Houston Siren      . alum & mag  hydroxide-simeth (MAALOX/MYLANTA) 200-200-20 MG/5ML suspension 30 mL  30 mL Oral Q6H PRN Houston Siren      . aspirin EC tablet 325 mg  325 mg Oral Daily Peter Le   325 mg at 06/07/11 0903  . enoxaparin (LOVENOX) injection 40 mg  40 mg Subcutaneous Q24H Peter Le   40 mg at 06/07/11 0903  . iohexol (OMNIPAQUE) 300 MG/ML injection 100 mL  100 mL Intravenous Once PRN Medication Radiologist   100 mL at 06/06/11 1721  . LORazepam (ATIVAN) tablet 1 mg  1 mg Oral Q8H PRN Houston Siren      . meclizine (ANTIVERT) tablet 25 mg  25 mg Oral TID Gerrit Friends. Rothbart, MD   25 mg at 06/07/11 1237  . morphine 2 MG/ML injection 2 mg  2 mg Intravenous Q2H PRN Houston Siren      . pantoprazole (PROTONIX) EC tablet 40 mg  40 mg Oral Q1200 Haifa Hatton Jarrett-Davis   40 mg at 06/07/11 1237  . sodium chloride 0.9 % injection 3 mL  3 mL Intravenous Q12H Peter Le   3 mL at 06/07/11 0903  . sodium chloride 0.9 % injection 3 mL  3 mL Intravenous PRN Houston Siren        Assessment/Plan: Chest pain Nausea alone Hypokalemia  Patient states that her chest pain has now resolved. She was evaluated by cardiology and found to be low risk. All cardiac markers are negative. She was told to follow up with her primary care physician for outpatient stress test. She complains of dizziness and tingling in her fingers. Will get head CT and B12 levels if those are normal then will discharge patient to followup with primary care physician.     LOS: 2 days   Earlene Plater MD, Ladell Pier 06/07/2011, 2:56 PM

## 2011-06-07 NOTE — Consult Note (Signed)
Cynthia Carr  45 y.o.  female  Subjective: Chest discomfort has improved. Patient notes milder sharp pain located at the upper left sternal border as well as episodic pressure are around her chest. She experiences some dyspnea with these spells. Nausea has improved. She is concerned that no one has addressed her "dizziness". The symptoms are more consistent with a vertigo than lightheadedness.  Patient is premenopausal, but periods been more regular of late. She denies excessive anxiety or current depression.  Patient appears mildly narcotize although she has not received narcotics for more than 24 hours.  Affect is somewhat flat, but not clearly depressed.  Allergy: Review of patient's allergies indicates no known allergies.  Objective: Vital signs in last 24 hours: Temp:  [97.9 F (36.6 C)-98.4 F (36.9 C)] 98.2 F (36.8 C) (11/18 0421) Pulse Rate:  [60-85] 60  (11/18 0421) Resp:  [16-18] 18  (11/18 0421) BP: (90-102)/(58-67) 90/58 mmHg (11/18 0421) SpO2:  [96 %-99 %] 96 % (11/18 0421) Weight:  [85.333 kg (188 lb 2 oz)] 188 lb 2 oz (85.333 kg) (11/18 0500)  188 lb 2 oz (85.333 kg) Body mass index is 33.32 kg/(m^2).  Weight change: -1.167 kg (-2 lb 9.2 oz) Last BM Date: 06/06/11  Intake/Output from previous day: 11/17 0701 - 11/18 0700 In: 603 [P.O.:600; I.V.:3] Out: 1225 [Urine:1225]  General- Well developed; no acute distress, overweight Neck- No JVD, no carotid bruits Lungs- clear lung fields; normal I:E ratio Cardiovascular- normal PMI; normal S1 and S2 Abdomen- normal bowel sounds; soft and non-tender without masses or organomegaly Skin- Warm, no significant lesions Extremities- Nl distal pulses; no edema  Lab Results: Cardiac Markers:   Basename 06/06/11 2345 06/06/11 1610  TROPONINI <0.30 <0.30   CBC:   Basename 06/07/11 0611 06/06/11 0455  WBC 3.5* 4.8  HGB 12.0 11.5*  HCT 35.2* 34.8*  PLT 237 239   BMET:  Basename 06/07/11 0611 06/06/11 0455  NA 135 138  K  4.2 3.4*  CL 103 106  CO2 25 26  GLUCOSE 97 100*  BUN 8 8  CREATININE 0.67 0.73  CALCIUM 8.6 8.8   Hepatic Function:   Basename 06/05/11 1635  PROT 7.1  ALBUMIN 3.7  AST 15  ALT 9  ALKPHOS 73  BILITOT 0.3  BILIDIR --  IBILI --   GFR:  Estimated Creatinine Clearance: 92 ml/min (by C-G formula based on Cr of 0.67). Lipids:    Imaging Studies/Results: Dg Chest 2 View  06/05/2011  *RADIOLOGY REPORT*  Clinical Data: S  IMPRESSION: No active cardiopulmonary disease.  Original Report Authenticated By: Harrel Lemon, M.D.   06/06/2011 CT ANGIOGRAPHY CHEST WITH CONTRAST   IMPRESSION: Negative.  No evidence of pulmonary embolism or other active disease.  Original Report Authenticated By: Danae Orleans, M.D.  CT scan reviewed with radiologist-no apparent calcification in the coronaries, although protocol for chest CT is not optimized to detect coronary calcification.  Imaging: Imaging results have been reviewed  Medications: I have reviewed the patient's current medications.  EKG: Pending   Assessment/Plan: Chest pain- Symptoms are improved. Discomfort is certainly noncardiac, but does not have characteristics that clearly allow one to make a diagnosis clinically. Causes with potential serious morbidity or mortality have been excluded. Once patient has improved symptomatically, she can be discharged with plans for an outpatient echocardiogram from a cardiac standpoint. Meclizine will be added for presumed vertigo.  Drug screen will be ordered in light of her possibly impaired mental status, but narcotics will probably  show up from drug administered in the emergency department.  LOS: 2 days   [place] Bing 06/07/2011, 7:56 AM

## 2011-06-08 MED ORDER — MECLIZINE HCL 25 MG PO TABS: 25.0000 mg | ORAL_TABLET | Freq: Four times a day (QID) | ORAL | Status: AC

## 2011-06-08 MED ORDER — MECLIZINE HCL 25 MG PO TABS
25.0000 mg | ORAL_TABLET | Freq: Four times a day (QID) | ORAL | Status: DC
  Administered 2011-06-08: 25 mg via ORAL
  Filled 2011-06-08 (×3): qty 1

## 2011-06-08 NOTE — Progress Notes (Signed)
Subjective: "I'm OK right now, but i am worried about this dizziness and trying to do stairs at my house". Denis pain  Objective: Vital signs Filed Vitals:   06/07/11 1730 06/07/11 1746 06/07/11 2042 06/08/11 0500  BP: 109/74 100/67 103/68 112/68  Pulse: 55 55 61 62  Temp:  98.5 F (36.9 C) 97.6 F (36.4 C) 97.9 F (36.6 C)  TempSrc:  Oral Oral Oral  Resp:  16 18 18   Height:      Weight:    85.425 kg (188 lb 5.3 oz)  SpO2:  99% 99% 98%   Weight change: 0.092 kg (3.3 oz) Last BM Date: 06/07/11  Intake/Output from previous day: 11/18 0701 - 11/19 0700 In: 723 [P.O.:720; I.V.:3] Out: 1400 [Urine:1400] Total I/O In: 354 [P.O.:354] Out: -    Physical Exam: General: Alert, awake, oriented x3, in no acute distress. HEENT: No bruits, no goiter. Mucus membranes mouth moist and pink Heart: Regular rate and rhythm, without murmurs, rubs, gallops. Lungs: Clear to auscultation bilaterally. Normal effort. No wheeze Abdomen: Soft, nontender, nondistended, positive bowel sounds. Extremities: No clubbing cyanosis or edema with positive pedal pulses. Neuro: Grossly intact, nonfocal.    Lab Results: Basic Metabolic Panel:  Basename 06/07/11 0611 06/06/11 0455  NA 135 138  K 4.2 3.4*  CL 103 106  CO2 25 26  GLUCOSE 97 100*  BUN 8 8  CREATININE 0.67 0.73  CALCIUM 8.6 8.8  MG -- --  PHOS -- --   Liver Function Tests:  Basename 06/05/11 1635  AST 15  ALT 9  ALKPHOS 73  BILITOT 0.3  PROT 7.1  ALBUMIN 3.7   No results found for this basename: LIPASE:2,AMYLASE:2 in the last 72 hours No results found for this basename: AMMONIA:2 in the last 72 hours CBC:  Basename 06/07/11 0611 06/06/11 0455 06/05/11 1635  WBC 3.5* 4.8 --  NEUTROABS -- -- 2.3  HGB 12.0 11.5* --  HCT 35.2* 34.8* --  MCV 88.9 89.2 --  PLT 237 239 --   Cardiac Enzymes:  Basename 06/06/11 2345 06/06/11 1610 06/06/11 0825  CKTOTAL 120 133 140  CKMB 3.9 4.8* 5.2*  CKMBINDEX -- -- --  TROPONINI  <0.30 <0.30 <0.30   BNP: No results found for this basename: POCBNP:3 in the last 72 hours D-Dimer:  Basename 06/06/11 1230  DDIMER 0.73*   CBG: No results found for this basename: GLUCAP:6 in the last 72 hours Hemoglobin A1C: No results found for this basename: HGBA1C in the last 72 hours Fasting Lipid Panel:  Basename 06/07/11 0611  CHOL 172  HDL 75  LDLCALC 88  TRIG 46  CHOLHDL 2.3  LDLDIRECT --   Thyroid Function Tests: No results found for this basename: TSH,T4TOTAL,FREET4,T3FREE,THYROIDAB in the last 72 hours Anemia Panel:  Basename 06/07/11 1630  VITAMINB12 443  FOLATE --  FERRITIN --  TIBC --  IRON --  RETICCTPCT --   Coagulation: No results found for this basename: LABPROT:2,INR:2 in the last 72 hours Urine Drug Screen:  Alcohol Level: No results found for this basename: ETH:2 in the last 72 hours Urinalysis:  Misc. Labs:  No results found for this or any previous visit (from the past 240 hour(s)).  Studies/Results: Ct Head Wo Contrast  06/07/2011  *RADIOLOGY REPORT*  Clinical Data:  Dizziness  CT HEAD WITHOUT CONTRAST  Technique:  Contiguous axial images were obtained from the base of the skull through the vertex without contrast  Comparison:  None.  Findings:  The brain has a  normal appearance without evidence for hemorrhage, acute infarction, hydrocephalus, or mass lesion.  There is no extra axial fluid collection.  The skull and paranasal sinuses are normal.  IMPRESSION: Normal CT of the head without contrast.  Original Report Authenticated By: Camelia Phenes, M.D.   Ct Angio Chest W/cm &/or Wo Cm  06/06/2011  *RADIOLOGY REPORT*  Clinical Data:  Shortness of breath.  Chest pain.  CT ANGIOGRAPHY CHEST WITH CONTRAST  Technique:  Multidetector CT imaging of the chest was performed using the standard protocol during bolus administration of intravenous contrast.  Multiplanar CT image reconstructions including MIPs were obtained to evaluate the vascular  anatomy.  Contrast: OMNIPAQUE IOHEXOL 300 MG/ML IV SOLN  Comparison:  None  Findings:  Satisfactory opacification of the pulmonary arteries noted, and there is no evidence of pulmonary emboli.  No evidence of thoracic aortic aneurysm or dissection.  No evidence of mediastinal or hilar masses.  No adenopathy seen elsewhere within the thorax.  No evidence of pleural or pericardial effusion.  Both lungs are clear.  No evidence of infiltrative mass.  No endobronchial lesion identified.  Review of the MIP images confirms the above findings.  IMPRESSION: Negative.  No evidence of pulmonary embolism or other active disease.  Original Report Authenticated By: Danae Orleans, M.D.    Medications: Scheduled Meds:   . aspirin EC  325 mg Oral Daily  . enoxaparin  40 mg Subcutaneous Q24H  . meclizine  25 mg Oral TID  . pantoprazole  40 mg Oral Q1200  . sodium chloride  3 mL Intravenous Q12H   Continuous Infusions:   . sodium chloride     PRN Meds:.alum & mag hydroxide-simeth, LORazepam, morphine, sodium chloride  Assessment/Plan:  Principal Problem: 1. *Chest pain at rest: resolved. Cardiac work up neg. Will need OP stress test. Active Problems:  2.Nausea alone: improved. Cont PPI 3. Hypokalemia resolved after repletion 4.Vertigo: continues with mild to moderate. Reports improvement with meclazine. Continue. Will ask PT eval . CT of head normal. B 12 pending    LOS: 3 days   [facility] M 06/08/2011, 10:18 AM

## 2011-06-08 NOTE — Discharge Summary (Signed)
Physician Discharge Summary  Patient ID: Cynthia Carr MRN: [id number] DOB/AGE: [birth year] 45 y.o.  Admit date: 06/05/2011 Discharge date: 06/08/2011  Primary Care Physician:  Georganna Skeans, MD, MD   Discharge Diagnoses:    Present on Admission:  .Chest pain at rest .Nausea alone  Current Discharge Medication List    START taking these medications   Details  aspirin EC 81 MG tablet Take 1 tablet (81 mg total) by mouth daily. Qty: 30 tablet, Refills: 0    meclizine (ANTIVERT) 25 MG tablet Take 1 tablet (25 mg total) by mouth 4 (four) times daily. Qty: 30 tablet, Refills: 0      STOP taking these medications     ibuprofen (ADVIL,MOTRIN) 200 MG tablet          Disposition and Follow-up: Pt. Medically stable and ready for discharge to home.  Consults:   Cardiology   Significant Diagnostic Studies:  Dg Chest 2 View  06/05/2011  *RADIOLOGY REPORT*  Clinical Data: Shortness of breath and chest pain, smoker  CHEST - 2 VIEW  Comparison:  None.  Findings:  The heart size and mediastinal contours are within normal limits.  Both lungs are clear.  The visualized skeletal structures are unremarkable.  IMPRESSION: No active cardiopulmonary disease.  Original Report Authenticated By: Harrel Lemon, M.D.   US Abdomen Complete  06/06/2011  *RADIOLOGY REPORT*  Clinical Data:  Chest pain, nausea, evaluate gallbladder  COMPLETE ABDOMINAL ULTRASOUND  Comparison:  None.  Findings:  Gallbladder:  No gallstones, gallbladder wall thickening, or pericholecystic fluid.  Negative sonographic Murphy's sign.  Common bile duct:  Measures 3.5 mm.  Liver:  No focal lesion identified.  Within normal limits in parenchymal echogenicity.  IVC:  Appears normal.  Pancreas:  Incompletely visualized but grossly unremarkable.  Spleen:  Measures 4.8 cm.  Right Kidney:  Measures 11.3 cm.  No mass or hydronephrosis.  Left Kidney:  Measures 11.7 cm.  No mass or hydronephrosis.  Abdominal aorta:  No aneurysm  identified.  IMPRESSION: Negative abdominal ultrasound.  Original Report Authenticated By: Charline Bills, M.D.    Labs Reviewed  BASIC METABOLIC PANEL - Abnormal; Notable for the following:    Potassium 3.4 (*)    Glucose, Bld 100 (*)    All other components within normal limits  CBC - Abnormal; Notable for the following:    Hemoglobin 11.5 (*)    HCT 34.8 (*)    All other components within normal limits  CARDIAC PANEL(CRET KIN+CKTOT+MB+TROPI) - Abnormal; Notable for the following:    CK, MB 5.2 (*)    Relative Index 3.7 (*)    All other components within normal limits  CARDIAC PANEL(CRET KIN+CKTOT+MB+TROPI) - Abnormal; Notable for the following:    CK, MB 4.8 (*)    Relative Index 3.6 (*)    All other components within normal limits  CARDIAC PANEL(CRET KIN+CKTOT+MB+TROPI) - Abnormal; Notable for the following:    Relative Index 3.3 (*)    All other components within normal limits  D-DIMER, QUANTITATIVE - Abnormal; Notable for the following:    D-Dimer, Quant 0.73 (*)    All other components within normal limits  CBC - Abnormal; Notable for the following:    WBC 3.5 (*)    HCT 35.2 (*)    All other components within normal limits  CBC  DIFFERENTIAL  COMPREHENSIVE METABOLIC PANEL  POCT I-STAT TROPONIN I  BASIC METABOLIC PANEL  LIPID PANEL  URINE RAPID DRUG SCREEN (HOSP PERFORMED)  VITAMIN B12  Dg Chest 2 View  06/05/2011  *RADIOLOGY REPORT*  Clinical Data: Shortness of breath and chest pain, smoker  CHEST - 2 VIEW  Comparison:  None.  Findings:  The heart size and mediastinal contours are within normal limits.  Both lungs are clear.  The visualized skeletal structures are unremarkable.  IMPRESSION: No active cardiopulmonary disease.  Original Report Authenticated By: Harrel Lemon, M.D.   Ct Head Wo Contrast  06/07/2011  *RADIOLOGY REPORT*  Clinical Data:  Dizziness  CT HEAD WITHOUT CONTRAST  Technique:  Contiguous axial images were obtained from the base  of the skull through the vertex without contrast  Comparison:  None.  Findings:  The brain has a normal appearance without evidence for hemorrhage, acute infarction, hydrocephalus, or mass lesion.  There is no extra axial fluid collection.  The skull and paranasal sinuses are normal.  IMPRESSION: Normal CT of the head without contrast.  Original Report Authenticated By: Camelia Phenes, M.D.   Ct Angio Chest W/cm &/or Wo Cm  06/06/2011  *RADIOLOGY REPORT*  Clinical Data:  Shortness of breath.  Chest pain.  CT ANGIOGRAPHY CHEST WITH CONTRAST  Technique:  Multidetector CT imaging of the chest was performed using the standard protocol during bolus administration of intravenous contrast.  Multiplanar CT image reconstructions including MIPs were obtained to evaluate the vascular anatomy.  Contrast: OMNIPAQUE IOHEXOL 300 MG/ML IV SOLN  Comparison:  None  Findings:  Satisfactory opacification of the pulmonary arteries noted, and there is no evidence of pulmonary emboli.  No evidence of thoracic aortic aneurysm or dissection.  No evidence of mediastinal or hilar masses.  No adenopathy seen elsewhere within the thorax.  No evidence of pleural or pericardial effusion.  Both lungs are clear.  No evidence of infiltrative mass.  No endobronchial lesion identified.  Review of the MIP images confirms the above findings.  IMPRESSION: Negative.  No evidence of pulmonary embolism or other active disease.  Original Report Authenticated By: Danae Orleans, M.D.   US Abdomen Complete  06/06/2011  *RADIOLOGY REPORT*  Clinical Data:  Chest pain, nausea, evaluate gallbladder  COMPLETE ABDOMINAL ULTRASOUND  Comparison:  None.  Findings:  Gallbladder:  No gallstones, gallbladder wall thickening, or pericholecystic fluid.  Negative sonographic Murphy's sign.  Common bile duct:  Measures 3.5 mm.  Liver:  No focal lesion identified.  Within normal limits in parenchymal echogenicity.  IVC:  Appears normal.  Pancreas:  Incompletely  visualized but grossly unremarkable.  Spleen:  Measures 4.8 cm.  Right Kidney:  Measures 11.3 cm.  No mass or hydronephrosis.  Left Kidney:  Measures 11.7 cm.  No mass or hydronephrosis.  Abdominal aorta:  No aneurysm identified.  IMPRESSION: Negative abdominal ultrasound.  Original Report Authenticated By: Charline Bills, M.D.       Brief H and P: For complete details please refer to admission H and P, but in brief  HPI:  Cynthia Carr is an 45 y.o. female with benign past medical history on no chronic medication presents to [facility] long emergency room complaints of intermittent chest pain for the past 2 days. She clearly states that her pain is not exertional and nonradiating. She did experience some paresthesia of both arms. She also experienced some nausea but no vomiting. There has been no fever, chills, cough, headaches, or any other symptomology. She does not think that her discomfort and nausea is related to eating.     Hospital Course:  No resolved problems to display.  Active Hospital Problems  Diagnoses Date Noted   . Chest pain at rest 06/06/2011   . Vertigo 06/07/2011   . Nausea alone 06/06/2011   . Hypokalemia 06/06/2011     Resolved Hospital Problems  Diagnoses Date Noted Date Resolved    Principal Problem: 1. *Chest pain at rest: admitted to tele for rule out. Cardiology consulted. MI ruled out. Discussion regarding stress test. No further CP. Pt reported that her symptoms more related to dizziness and some tingling of upper extremeties.. Per cardiology causes with potential serious morbidity or mortality have been excluded.  Recommended discharge when symptoms improve. Pts. Cp resolved at the time of discharge. Active Problems:  2.Nausea alone: resolved. Tolerating diet 3. Hypokalemia: resolved 4. Vertigo: reports improvement with meclizine. Frequency increased. Pt ambulating in room without assistance and without problem. Will continue meclizine at  discharge   Time spent on Discharge: 30 min  Signed: Gwenyth Bender 06/08/2011, 3:15 PM Agree with the note above. Patient seen and examined with Toya Smothers NP. Patient discharged home to follow up with [facility]

## 2011-06-08 NOTE — Progress Notes (Signed)
PATIENT IS AGREEABLE TO FOLLOW UP MEDICAL CARE WITH [facility]; PATIENT HAS SEEN DR Andrey Campanile IN THE PAST; ELIGIBILITY APT IS Jul 29, 2011 AT 2:15 AND APT WITH DR Andrey Campanile IS Aug 04, 2011 AT 3:15; INFORMATION GIVEN TO THE PATIENT; B. Delrae Hagey RN, BSN, MHA

## 2011-08-25 NOTE — Progress Notes (Signed)
Agree with the above note 

## 2012-03-09 ENCOUNTER — Emergency Department (HOSPITAL_COMMUNITY)
Admission: EM | Admit: 2012-03-09 | Discharge: 2012-03-09 | Disposition: A | Discharge status: Home / Self Care | Payer: Self-pay | Attending: Emergency Medicine | Admitting: Emergency Medicine

## 2012-03-09 ENCOUNTER — Emergency Department (HOSPITAL_COMMUNITY): Payer: Self-pay

## 2012-03-09 ENCOUNTER — Encounter (HOSPITAL_COMMUNITY): Payer: Self-pay | Admitting: *Deleted

## 2012-03-09 DIAGNOSIS — M79609 Pain in unspecified limb: Secondary | ICD-10-CM | POA: Insufficient documentation

## 2012-03-09 DIAGNOSIS — F17200 Nicotine dependence, unspecified, uncomplicated: Secondary | ICD-10-CM | POA: Insufficient documentation

## 2012-03-09 DIAGNOSIS — R600 Localized edema: Secondary | ICD-10-CM

## 2012-03-09 DIAGNOSIS — R42 Dizziness and giddiness: Secondary | ICD-10-CM

## 2012-03-09 DIAGNOSIS — M7989 Other specified soft tissue disorders: Secondary | ICD-10-CM | POA: Insufficient documentation

## 2012-03-09 DIAGNOSIS — F411 Generalized anxiety disorder: Secondary | ICD-10-CM | POA: Insufficient documentation

## 2012-03-09 DIAGNOSIS — R609 Edema, unspecified: Secondary | ICD-10-CM | POA: Insufficient documentation

## 2012-03-09 LAB — URINALYSIS, ROUTINE W REFLEX MICROSCOPIC
Bilirubin Urine: NEGATIVE
Nitrite: NEGATIVE
Specific Gravity, Urine: 1.027 (ref 1.005–1.030)
pH: 5.5 (ref 5.0–8.0)

## 2012-03-09 LAB — URINE MICROSCOPIC-ADD ON

## 2012-03-09 LAB — BASIC METABOLIC PANEL
BUN: 10 mg/dL (ref 6–23)
Chloride: 106 mEq/L (ref 96–112)
GFR calc Af Amer: >90 mL/min (ref >90)
Potassium: 3.9 mEq/L (ref 3.5–5.1)

## 2012-03-09 MED ORDER — FUROSEMIDE 20 MG PO TABS: 20.0000 mg | ORAL_TABLET | Freq: Every day | ORAL | Status: DC

## 2012-03-09 MED ORDER — MECLIZINE HCL 25 MG PO TABS: 25.0000 mg | ORAL_TABLET | Freq: Three times a day (TID) | ORAL | Status: DC | PRN

## 2012-03-09 MED ORDER — GABAPENTIN 300 MG PO CAPS: 300.0000 mg | ORAL_CAPSULE | Freq: Three times a day (TID) | ORAL | Status: DC

## 2012-03-09 NOTE — ED Notes (Signed)
Pt c/o fluid building up in lower leg x 1 week, also states legs are painful, feels like they are being stabbed.  Dizziness x 2 weeks.

## 2012-03-09 NOTE — ED Notes (Signed)
Pt denies any questions upon discharge. 

## 2012-03-09 NOTE — ED Notes (Signed)
Pt visitor at bedside request for MD to be notified of "her blood pressure is too high for her". Pt blood pressure is 104/66, MD made aware with no further orders given. Pt is awaiting discharge.

## 2012-03-09 NOTE — ED Notes (Signed)
Pt d/c home. Girlfriend at bedside. Ambulatory. A.O. X 4. NAD. Respirations even and regular. No further needs at this time.

## 2012-03-09 NOTE — ED Notes (Signed)
Pt ambulatory to restroom with steady gait, pt denies any new dizziness upon ambulating or after return to bed. Will continue to monitor pt.

## 2012-03-09 NOTE — ED Provider Notes (Signed)
History     CSN: [id number]  Arrival date & time 03/09/12  0018   First MD Initiated Contact with Patient 03/09/12 0210      Chief Complaint  Patient presents with  . Dizziness  . Leg Pain    (Consider location/radiation/quality/duration/timing/severity/associated sxs/prior treatment) HPI 46 year old female presents to emergency apartment complaining of bilateral leg swelling for the last 2 weeks with tightness and pins and needle sensation. Patient also is complaining of one year history of vertigo. Patient is a [facility] patient which recently closed and she no longer has access to care. Patient has been taking Antivert which helps her vertigo symptoms. She denies any shortness of breath, no history of congestive heart failure. She has remote history of DVT in pregnancy, but reports she does not have any symptoms of DVT similar to her prior episode today. Patient reports swelling is worse at the end of day, improved with putting her feet up. No history of kidney disease.  Past Medical History  Diagnosis Date  . DVT (deep vein thrombosis) in pregnancy   . Angina   . Shortness of breath   . Anxiety     Past Surgical History  Procedure Date  . Tubal ligation     History reviewed. No pertinent family history.  History  Substance Use Topics  . Smoking status: Current Everyday Smoker -- 0.5 packs/day for 20 years  . Smokeless tobacco: Not on file  . Alcohol Use: Yes     ocasionally    OB History    Grav Para Term Preterm Abortions TAB SAB Ect Mult Living                  Review of Systems  All other systems reviewed and are negative.    Allergies  Percocet  Home Medications   Current Outpatient Rx  Name Route Sig Dispense Refill  . ASPIRIN EC 81 MG PO TBEC Oral Take 1 tablet (81 mg total) by mouth daily. 30 tablet 0  . FUROSEMIDE 20 MG PO TABS Oral Take 1 tablet (20 mg total) by mouth daily. As needed for swelling in legs 30 tablet 0  . GABAPENTIN 300 MG  PO CAPS Oral Take 1 capsule (300 mg total) by mouth 3 (three) times daily. 30 capsule 0  . MECLIZINE HCL 25 MG PO TABS Oral Take 1 tablet (25 mg total) by mouth 3 (three) times daily as needed. For dizziness 30 tablet 0    BP 104/66  Pulse 85  Temp 97.6 F (36.4 C) (Oral)  Resp 17  SpO2 98%  Physical Exam  Nursing note and vitals reviewed. Constitutional: She is oriented to person, place, and time. She appears well-developed and well-nourished. No distress.  HENT:  Head: Normocephalic and atraumatic.  Nose: Nose normal.  Mouth/Throat: Oropharynx is clear and moist.  Eyes: Conjunctivae and EOM are normal. Pupils are equal, round, and reactive to light.  Neck: Normal range of motion. Neck supple. No JVD present. No tracheal deviation present. No thyromegaly present.  Cardiovascular: Normal rate, regular rhythm, normal heart sounds and intact distal pulses.  Exam reveals no gallop and no friction rub.   No murmur heard. Pulmonary/Chest: Effort normal and breath sounds normal. No stridor. No respiratory distress. She has no wheezes. She has no rales. She exhibits no tenderness.  Abdominal: Soft. Bowel sounds are normal. She exhibits no distension and no mass. There is no tenderness. There is no rebound and no guarding.  Musculoskeletal: Normal range of  motion. She exhibits edema (Trace amount edema in bilateral lower extremities, no pitting edema, slight crease from her socks.). She exhibits no tenderness.  Lymphadenopathy:    She has no cervical adenopathy.  Neurological: She is alert and oriented to person, place, and time. She has normal reflexes. No cranial nerve deficit. She exhibits normal muscle tone. Coordination normal.  Skin: Skin is warm and dry. No rash noted. She is not diaphoretic. No erythema. No pallor.  Psychiatric: She has a normal mood and affect. Her behavior is normal. Judgment and thought content normal.    ED Course  Procedures (including critical care  time)  Labs Reviewed  URINALYSIS, ROUTINE W REFLEX MICROSCOPIC - Abnormal; Notable for the following:    APPearance CLOUDY (*)     Leukocytes, UA TRACE (*)     All other components within normal limits  URINE MICROSCOPIC-ADD ON - Abnormal; Notable for the following:    Squamous Epithelial / LPF MANY (*)     Bacteria, UA MANY (*)     All other components within normal limits  BASIC METABOLIC PANEL   Dg Chest 2 View  03/09/2012  *RADIOLOGY REPORT*  Clinical Data: Pain and swelling in the legs.  Dizziness.  CHEST - 2 VIEW  Comparison: 06/05/2011  Findings: The heart size and pulmonary vascularity are normal. The lungs appear clear and expanded without focal air space disease or consolidation. No blunting of the costophrenic angles.  No pneumothorax.  Mediastinal contours appear intact.  No significant change since previous study.  IMPRESSION: No evidence of active pulmonary disease.   Original Report Authenticated By: Marlon Pel, M.D.      1. Peripheral edema   2. Vertigo       MDM  Workup unremarkable for causes for edema. Only a small amount edema noted. Patient complaining of pins and needle sensation to her lower extremities, we'll try gabapentin. We'll also start low dose Lasix. Of note, upon discharge patient's friend was very concerned about her blood pressure. Initial blood pressure was 133/82. Her discharge blood pressure was 104/66. Patient's friend reports the patient's normal blood pressure is 80s over 40s, and any blood pressure above this level is "very dangerous for her" she reports patient becomes confused and altered if she has "high blood pressures" I explained to the patient and her friend that I was not concerned about a blood pressure of 104/66. I reviewed her last admission, and patient's blood pressure ranged from 90s to 1 teens systolic. Patient given resource list for local primary care physicians for further management       Olivia Mackie, MD 03/09/12  [phone]

## 2013-05-16 ENCOUNTER — Emergency Department (HOSPITAL_COMMUNITY): Payer: Self-pay

## 2013-05-16 ENCOUNTER — Emergency Department (HOSPITAL_COMMUNITY)
Admission: EM | Admit: 2013-05-16 | Discharge: 2013-05-16 | Disposition: A | Discharge status: Home / Self Care | Payer: Self-pay | Attending: Emergency Medicine | Admitting: Emergency Medicine

## 2013-05-16 ENCOUNTER — Encounter (HOSPITAL_COMMUNITY): Payer: Self-pay | Admitting: Emergency Medicine

## 2013-05-16 ENCOUNTER — Emergency Department (HOSPITAL_COMMUNITY)
Admission: EM | Admit: 2013-05-16 | Discharge: 2013-05-17 | Disposition: A | Discharge status: Home / Self Care | Payer: Self-pay | Attending: Emergency Medicine | Admitting: Emergency Medicine

## 2013-05-16 DIAGNOSIS — Z86718 Personal history of other venous thrombosis and embolism: Secondary | ICD-10-CM | POA: Insufficient documentation

## 2013-05-16 DIAGNOSIS — H81399 Other peripheral vertigo, unspecified ear: Secondary | ICD-10-CM | POA: Insufficient documentation

## 2013-05-16 DIAGNOSIS — Z8679 Personal history of other diseases of the circulatory system: Secondary | ICD-10-CM | POA: Insufficient documentation

## 2013-05-16 DIAGNOSIS — F17200 Nicotine dependence, unspecified, uncomplicated: Secondary | ICD-10-CM | POA: Insufficient documentation

## 2013-05-16 DIAGNOSIS — Z8659 Personal history of other mental and behavioral disorders: Secondary | ICD-10-CM | POA: Insufficient documentation

## 2013-05-16 DIAGNOSIS — R42 Dizziness and giddiness: Secondary | ICD-10-CM | POA: Insufficient documentation

## 2013-05-16 DIAGNOSIS — R110 Nausea: Secondary | ICD-10-CM | POA: Insufficient documentation

## 2013-05-16 DIAGNOSIS — I209 Angina pectoris, unspecified: Secondary | ICD-10-CM | POA: Insufficient documentation

## 2013-05-16 DIAGNOSIS — Z7982 Long term (current) use of aspirin: Secondary | ICD-10-CM | POA: Insufficient documentation

## 2013-05-16 DIAGNOSIS — M79609 Pain in unspecified limb: Secondary | ICD-10-CM | POA: Insufficient documentation

## 2013-05-16 DIAGNOSIS — R0789 Other chest pain: Secondary | ICD-10-CM | POA: Insufficient documentation

## 2013-05-16 LAB — COMPREHENSIVE METABOLIC PANEL
ALT: 13 U/L (ref 0–35)
AST: 19 U/L (ref 0–37)
Calcium: 9.1 mg/dL (ref 8.4–10.5)
Creatinine, Ser: 0.56 mg/dL (ref 0.50–1.10)
GFR calc Af Amer: >90 mL/min (ref >90)
Sodium: 139 mEq/L (ref 135–145)
Total Protein: 7.5 g/dL (ref 6.0–8.3)

## 2013-05-16 LAB — POCT I-STAT TROPONIN I: Troponin i, poc: 0 ng/mL (ref 0.00–0.08)

## 2013-05-16 LAB — CBC WITH DIFFERENTIAL/PLATELET
Basophils Absolute: 0 10*3/uL (ref 0.0–0.1)
Eosinophils Absolute: 0.1 10*3/uL (ref 0.0–0.7)
Eosinophils Relative: 2 % (ref 0–5)
MCH: 31.1 pg (ref 26.0–34.0)
MCHC: 35.2 g/dL (ref 30.0–36.0)
MCV: 88.5 fL (ref 78.0–100.0)
Monocytes Absolute: 0.3 10*3/uL (ref 0.1–1.0)
Platelets: 240 10*3/uL (ref 150–400)
RDW: 13.9 % (ref 11.5–15.5)

## 2013-05-16 MED ORDER — MECLIZINE HCL 25 MG PO TABS
25.0000 mg | ORAL_TABLET | Freq: Once | ORAL | Status: AC
  Administered 2013-05-16: 25 mg via ORAL
  Filled 2013-05-16: qty 1

## 2013-05-16 MED ORDER — MECLIZINE HCL 25 MG PO TABS: 25.0000 mg | ORAL_TABLET | Freq: Three times a day (TID) | ORAL | Status: DC | PRN

## 2013-05-16 NOTE — ED Provider Notes (Signed)
CSN: [id number]     Arrival date & time 05/16/13  0006 History   First MD Initiated Contact with Patient 05/16/13 0230     Chief Complaint  Patient presents with  . Nausea  . Dizziness   HPI  History provided by the patient. Patient is a 47 year old female who presents with complaints of dizziness, nausea and chest pressure. Patient states that she feels like the dizziness began first and is described as the room spinning. This first began around 7 PM followed by some nausea with severe dizziness spells. She reports feeling off balance when she walks and uses the wall for support. She also began to feel a central chest heaviness that was mild and dull in nature. She denied having any severe or sharp pains to the chest. No shortness of breath. No cough or hemoptysis. She did not have any episodes of vomiting. She has been feeling well recently without any cough or congestion symptoms no fever, chills or sweats. She does not take any medications or used any treatment for her symptoms. Symptoms are worse with movements and turning of the head. Dizziness however remains fairly constant. No other aggravating or alleviating factors. No other associated symptoms.   Past Medical History  Diagnosis Date  . DVT (deep vein thrombosis) in pregnancy   . Angina   . Shortness of breath   . Anxiety    Past Surgical History  Procedure Laterality Date  . Tubal ligation     No family history on file. History  Substance Use Topics  . Smoking status: Current Every Day Smoker -- 0.50 packs/day for 20 years  . Smokeless tobacco: Not on file  . Alcohol Use: Yes     Comment: ocasionally   OB History   Grav Para Term Preterm Abortions TAB SAB Ect Mult Living                 Review of Systems  Constitutional: Negative for fever, chills and diaphoresis.  Respiratory: Negative for cough and shortness of breath.   Cardiovascular: Positive for chest pain. Negative for palpitations.  Gastrointestinal:  Positive for nausea. Negative for vomiting and abdominal pain.  Neurological: Positive for dizziness. Negative for weakness and numbness.  All other systems reviewed and are negative.    Allergies  Percocet  Home Medications  No current outpatient prescriptions on file. BP 119/82  Pulse 81  Temp(Src) 97.9 F (36.6 C) (Oral)  Resp 18  Ht 5\' 3"  (1.6 m)  Wt 211 lb 3.2 oz (95.8 kg)  BMI 37.42 kg/m2  SpO2 100%  LMP 05/05/2013 Physical Exam  Nursing note and vitals reviewed. Constitutional: She is oriented to person, place, and time. She appears well-developed and well-nourished. No distress.  HENT:  Head: Normocephalic and atraumatic.  Right Ear: Tympanic membrane normal.  Left Ear: Tympanic membrane normal.  Mouth/Throat: Oropharynx is clear and moist.  Eyes: Conjunctivae and EOM are normal. Pupils are equal, round, and reactive to light.  Neck: Normal range of motion. Neck supple.  No meningeal signs  Cardiovascular: Normal rate and regular rhythm.   No murmur heard. Pulmonary/Chest: Effort normal and breath sounds normal. No stridor. No respiratory distress. She has no wheezes. She has no rales.  Abdominal: Soft. She exhibits no distension. There is no tenderness. There is no rigidity, no rebound, no guarding, no CVA tenderness and no tenderness at McBurney's point.  Musculoskeletal: Normal range of motion. She exhibits no edema and no tenderness.  No clinical signs concern for  DVT  Neurological: She is alert and oriented to person, place, and time. She has normal strength. No cranial nerve deficit or sensory deficit. Gait normal.  Skin: Skin is warm and dry. No rash noted.  Psychiatric: She has a normal mood and affect. Her behavior is normal.    ED Course  Procedures   DIAGNOSTIC STUDIES: Oxygen Saturation is 100% on room air.    COORDINATION OF CARE:  Nursing notes reviewed. Vital signs reviewed. Initial pt interview and examination performed.   3:35 AM-patient  seen and evaluated. She appears comfortable in no acute distress. She has a normal nonfocal neuro exam. She reports similar symptoms 2 years ago. Review of her medical records demonstrate a normal CT scan of the head at that time. She reports being treated with meclizine and did have improvement of symptoms over a few days.   Initial lab testing, troponin, EKG and chest x-ray do not show any signs for acute findings or emergent conditions.  Patient discussed with attending physician, Dr. Norlene Campbell. Will add a second troponin otherwise feel symptoms most likely peripheral vertigo. No indications for a head CT at this time.   Results for orders placed during the hospital encounter of 05/16/13  CBC WITH DIFFERENTIAL      Result Value Range   WBC 5.5  4.0 - 10.5 K/uL   RBC 4.08  3.87 - 5.11 MIL/uL   Hemoglobin 12.7  12.0 - 15.0 g/dL   HCT 16.1  09.6 - 04.5 %   MCV 88.5  78.0 - 100.0 fL   MCH 31.1  26.0 - 34.0 pg   MCHC 35.2  30.0 - 36.0 g/dL   RDW 40.9  81.1 - 91.4 %   Platelets 240  150 - 400 K/uL   Neutrophils Relative % 46  43 - 77 %   Neutro Abs 2.5  1.7 - 7.7 K/uL   Lymphocytes Relative 45  12 - 46 %   Lymphs Abs 2.5  0.7 - 4.0 K/uL   Monocytes Relative 6  3 - 12 %   Monocytes Absolute 0.3  0.1 - 1.0 K/uL   Eosinophils Relative 2  0 - 5 %   Eosinophils Absolute 0.1  0.0 - 0.7 K/uL   Basophils Relative 0  0 - 1 %   Basophils Absolute 0.0  0.0 - 0.1 K/uL  COMPREHENSIVE METABOLIC PANEL      Result Value Range   Sodium 139  135 - 145 mEq/L   Potassium 3.3 (*) 3.5 - 5.1 mEq/L   Chloride 104  96 - 112 mEq/L   CO2 24  19 - 32 mEq/L   Glucose, Bld 93  70 - 99 mg/dL   BUN 7  6 - 23 mg/dL   Creatinine, Ser 7.82  0.50 - 1.10 mg/dL   Calcium 9.1  8.4 - 95.6 mg/dL   Total Protein 7.5  6.0 - 8.3 g/dL   Albumin 3.7  3.5 - 5.2 g/dL   AST 19  0 - 37 U/L   ALT 13  0 - 35 U/L   Alkaline Phosphatase 78  39 - 117 U/L   Total Bilirubin 0.3  0.3 - 1.2 mg/dL   GFR calc non Af Amer >90  >90 mL/min    GFR calc Af Amer >90  >90 mL/min  POCT I-STAT TROPONIN I      Result Value Range   Troponin i, poc 0.01  0.00 - 0.08 ng/mL   Comment 3  Imaging Review Dg Chest 2 View  05/16/2013   CLINICAL DATA:  Nausea and dizziness.  EXAM: CHEST  2 VIEW  COMPARISON:  Chest radiograph March 09, 2012  FINDINGS: Cardiomediastinal silhouette is unremarkable. The lungs are clear without pleural effusions or focal consolidations. Pulmonary vasculature is unremarkable. Trachea projects midline and there is no pneumothorax. Soft tissue planes and included osseous structures are nonsuspicious.  IMPRESSION: No active cardiopulmonary disease.   Electronically Signed   By: Awilda Metro   On: 05/16/2013 03:07    EKG Interpretation   None        Date: 05/16/2013  Rate: 72  Rhythm: normal sinus rhythm and sinus arrhythmia  QRS Axis: normal  Intervals: normal  ST/T Wave abnormalities: nonspecific ST/T changes  Conduction Disutrbances:none  Narrative Interpretation:   Old EKG Reviewed: No significant change from 06/07/2011    MDM   1. Peripheral vertigo, unspecified laterality        Angus Seller, PA-C 05/16/13 [phone]

## 2013-05-16 NOTE — ED Notes (Signed)
During review of discharge instructions pt verbalized she is very unhappy and feels like nothing was done here to help her. She explains that she has been dizzy for 2 years now but is upset because she was prescribed antivert here but has already been taking it and it has never worked for her. She hoped the ED MD could give her something or do something that has not been done that would stop the dizziness. In addition, patient was seen by Theron Arista, [place] but is very upset that she was not directly seen by Dr.Otter. Pt has reported right leg pain during ED visit and told the PA. She is concerned that she may have a blood clot in her right leg and states she has had over 30 blood clots in her leg previously and doesn't understand why the Dr or PA doesn't feel it necessary to do additional testing to rule out blood clots. She is very upset that Dr. Norlene Campbell "did not want see me. It is impossible to diagnosis someone without seeing them." I apologized to the patient continuously and asked her if she wanted me to get Dr. Norlene Campbell to come speak with her in person, but patient declined. I also told her it was not true that Dr. Norlene Campbell did not want to see her and informed her that here in the ED our priority is to rule out life threatening emergent conditions, none of which were found today for this patient. I also explained the importance of following up with a primary care physician. I asked the patient again if she wanted me to get Dr. Norlene Campbell to come speak with her personally and pt declined, stating, "I don't want to see her because she did not want to come see me." I informed her that the PA and Dr. Norlene Campbell are working together as a team and the PA reports back to Dr. Norlene Campbell. I then apologized again. Pt A&Ox4, ambulatory at discharge.

## 2013-05-16 NOTE — ED Notes (Signed)
Pt. Reports nausea and dizziness with chest pressure onset this evening , denies SOB or pain .

## 2013-05-16 NOTE — ED Provider Notes (Signed)
Medical screening examination/treatment/procedure(s) were performed by non-physician practitioner and as supervising physician I was immediately available for consultation/collaboration.  EKG Interpretation     Ventricular Rate:  72 PR Interval:  138 QRS Duration: 88 QT Interval:  416 QTC Calculation: 455 R Axis:   24 Text Interpretation:  Normal sinus rhythm with sinus arrhythmia Cannot rule out Anterior infarct , age undetermined Abnormal ECG No significant change since last tracing             Olivia Mackie, MD 05/16/13 [phone]

## 2013-05-17 ENCOUNTER — Encounter (HOSPITAL_COMMUNITY): Payer: Self-pay | Admitting: Emergency Medicine

## 2013-05-17 LAB — BASIC METABOLIC PANEL
BUN: 7 mg/dL (ref 6–23)
Chloride: 105 mEq/L (ref 96–112)
GFR calc Af Amer: >90 mL/min (ref >90)
GFR calc non Af Amer: >90 mL/min (ref >90)
Potassium: 4 mEq/L (ref 3.5–5.1)
Sodium: 139 mEq/L (ref 135–145)

## 2013-05-17 LAB — D-DIMER, QUANTITATIVE: D-Dimer, Quant: 0.41 ug/mL-FEU (ref 0.00–0.48)

## 2013-05-17 LAB — URINALYSIS, ROUTINE W REFLEX MICROSCOPIC
Leukocytes, UA: NEGATIVE
Nitrite: NEGATIVE
Specific Gravity, Urine: 1.033 — ABNORMAL HIGH (ref 1.005–1.030)
Urobilinogen, UA: 1 mg/dL (ref 0.0–1.0)
pH: 6 (ref 5.0–8.0)

## 2013-05-17 LAB — URINE MICROSCOPIC-ADD ON

## 2013-05-17 LAB — CBC WITH DIFFERENTIAL/PLATELET
Basophils Relative: 0 % (ref 0–1)
Eosinophils Absolute: 0.2 10*3/uL (ref 0.0–0.7)
Hemoglobin: 12 g/dL (ref 12.0–15.0)
MCHC: 33.8 g/dL (ref 30.0–36.0)
Monocytes Relative: 7 % (ref 3–12)
Neutro Abs: 2.2 10*3/uL (ref 1.7–7.7)
Neutrophils Relative %: 39 % — ABNORMAL LOW (ref 43–77)
Platelets: 238 10*3/uL (ref 150–400)
RBC: 4.04 MIL/uL (ref 3.87–5.11)

## 2013-05-17 MED ORDER — MECLIZINE HCL 25 MG PO TABS: 25.0000 mg | ORAL_TABLET | Freq: Three times a day (TID) | ORAL | Status: AC | PRN

## 2013-05-17 NOTE — ED Notes (Signed)
Patient with c/o dizziness x 2 years Patient states that she was dx with vertigo and Rx with Antivert, but patient states "I don't have a high tolerance for medications." Patient also with c/o right leg pain which started x 1-2 days ago--denies fall or injury Patient states she is able to ambulate without difficulty, full ROM and able to place weight on right leg Patient alert and oriented LCTA Patient appears in NAD Side rails up, call bell in reach

## 2013-05-17 NOTE — ED Notes (Signed)
Pt was seen at [facility] for same complaints, reports pain to leg has increased and dizziness is persisting.  States her urine was not tested and would like this to be done as well.

## 2013-05-17 NOTE — ED Provider Notes (Signed)
CSN: [id number]     Arrival date & time 05/16/13  2346 History   First MD Initiated Contact with Patient 05/17/13 0007     Chief Complaint  Patient presents with  . Leg Pain  . Dizziness   (Consider location/radiation/quality/duration/timing/severity/associated sxs/prior Treatment) HPI Comments: Presents to the ER for evaluation of several complaints. Patient reports that she has been having intermittent dizziness for 2 years. She was told that it was vertigo. She reports that she is always dizzy, but tonight her dizziness worsened. She has had sensation of spinning and movement. At times this is severe. She does not have a headache. She does, however, report that she has been having headaches over the past year. She has not been seen by a specialist for dizziness or headaches.  Patient also complaining of pain in the right lower leg. She reports that she has moderate to severe pain between the ankle and knee. No injury. She does have a history of DVT during pregnancy.  Patient is a 47 y.o. female presenting with leg pain.  Leg Pain Associated symptoms: no fever     Past Medical History  Diagnosis Date  . DVT (deep vein thrombosis) in pregnancy   . Angina   . Shortness of breath   . Anxiety    Past Surgical History  Procedure Laterality Date  . Tubal ligation     History reviewed. No pertinent family history. History  Substance Use Topics  . Smoking status: Current Every Day Smoker -- 0.50 packs/day for 20 years  . Smokeless tobacco: Not on file  . Alcohol Use: Yes     Comment: ocasionally   OB History   Grav Para Term Preterm Abortions TAB SAB Ect Mult Living                 Review of Systems  Constitutional: Negative for fever.  Respiratory: Negative for shortness of breath.   Cardiovascular: Negative for chest pain.  Neurological: Positive for dizziness and headaches.  All other systems reviewed and are negative.    Allergies  Percocet  Home Medications    Current Outpatient Rx  Name  Route  Sig  Dispense  Refill  . aspirin EC 81 MG tablet   Oral   Take 81 mg by mouth daily.          BP 134/91  Pulse 73  Temp(Src) 97.7 F (36.5 C) (Oral)  Resp 18  Ht 5\' 3"  (1.6 m)  Wt 211 lb (95.709 kg)  BMI 37.39 kg/m2  SpO2 100%  LMP 05/05/2013 Physical Exam  Constitutional: She is oriented to person, place, and time. She appears well-developed and well-nourished. No distress.  HENT:  Head: Normocephalic and atraumatic.  Right Ear: Hearing normal.  Left Ear: Hearing normal.  Nose: Nose normal.  Mouth/Throat: Oropharynx is clear and moist and mucous membranes are normal.  Eyes: Conjunctivae and EOM are normal. Pupils are equal, round, and reactive to light.  Neck: Normal range of motion. Neck supple.  Cardiovascular: Regular rhythm, S1 normal and S2 normal.  Exam reveals no gallop and no friction rub.   No murmur heard. Pulmonary/Chest: Effort normal and breath sounds normal. No respiratory distress. She exhibits no tenderness.  Abdominal: Soft. Normal appearance and bowel sounds are normal. There is no hepatosplenomegaly. There is no tenderness. There is no rebound, no guarding, no tenderness at McBurney's point and negative Murphy's sign. No hernia.  Musculoskeletal: Normal range of motion.  Neurological: She is alert and oriented to  person, place, and time. She has normal strength. No cranial nerve deficit or sensory deficit. Coordination normal. GCS eye subscore is 4. GCS verbal subscore is 5. GCS motor subscore is 6.  Skin: Skin is warm, dry and intact. No rash noted. No cyanosis.  Psychiatric: She has a normal mood and affect. Her speech is normal and behavior is normal. Thought content normal.    ED Course  Procedures (including critical care time) Labs Review Labs Reviewed  CBC WITH DIFFERENTIAL  BASIC METABOLIC PANEL  URINALYSIS, ROUTINE W REFLEX MICROSCOPIC  D-DIMER, QUANTITATIVE   Imaging Review Dg Chest 2  View  05/16/2013   CLINICAL DATA:  Nausea and dizziness.  EXAM: CHEST  2 VIEW  COMPARISON:  Chest radiograph March 09, 2012  FINDINGS: Cardiomediastinal silhouette is unremarkable. The lungs are clear without pleural effusions or focal consolidations. Pulmonary vasculature is unremarkable. Trachea projects midline and there is no pneumothorax. Soft tissue planes and included osseous structures are nonsuspicious.  IMPRESSION: No active cardiopulmonary disease.   Electronically Signed   By: Awilda Metro   On: 05/16/2013 03:07    EKG Interpretation   None       MDM  Diagnosis: 1. Leg pain, unclear etiology 2. Vertigo  Patient presents to the ER for evaluation multiple complaints. Looking back through her records, she has had presentations with identical complaints in the past. Patient has at least a two-year history of gout. She is not currently taking it. Symptoms today did seem consistent with vertigo and she has a normal neurologic exam. He did not feel that imaging was necessary at this time. Lab work was normal. She does have a history of DVT and is complaining of leg pain. There is no calf swelling, venous cords or any physical evaluation signs concerning for DVT, however. D-dimer was negative.  Patient will be treated for vertigo with meclizine. She has had ongoing episodes of vertigo as well as now experiencing headaches over the past year, will refer to neurology.   Gilda Crease, MD 05/17/13 [phone]

## 2013-05-17 NOTE — ED Notes (Signed)
Patient resting in position of comfort with eyes closed RR WNL--even and unlabored with equal rise and fall of chest Patient in NAD Side rails up, call bell in reach  

## 2013-06-08 ENCOUNTER — Emergency Department (HOSPITAL_COMMUNITY)
Admission: EM | Admit: 2013-06-08 | Discharge: 2013-06-08 | Disposition: A | Discharge status: Home / Self Care | Payer: Self-pay | Attending: Emergency Medicine | Admitting: Emergency Medicine

## 2013-06-08 ENCOUNTER — Encounter (HOSPITAL_COMMUNITY): Payer: Self-pay | Admitting: Emergency Medicine

## 2013-06-08 ENCOUNTER — Emergency Department (HOSPITAL_COMMUNITY): Payer: Self-pay

## 2013-06-08 DIAGNOSIS — F17200 Nicotine dependence, unspecified, uncomplicated: Secondary | ICD-10-CM | POA: Insufficient documentation

## 2013-06-08 DIAGNOSIS — S8990XA Unspecified injury of unspecified lower leg, initial encounter: Secondary | ICD-10-CM | POA: Insufficient documentation

## 2013-06-08 DIAGNOSIS — Y9301 Activity, walking, marching and hiking: Secondary | ICD-10-CM | POA: Insufficient documentation

## 2013-06-08 DIAGNOSIS — Z7982 Long term (current) use of aspirin: Secondary | ICD-10-CM | POA: Insufficient documentation

## 2013-06-08 DIAGNOSIS — Y92009 Unspecified place in unspecified non-institutional (private) residence as the place of occurrence of the external cause: Secondary | ICD-10-CM | POA: Insufficient documentation

## 2013-06-08 DIAGNOSIS — R42 Dizziness and giddiness: Secondary | ICD-10-CM | POA: Insufficient documentation

## 2013-06-08 DIAGNOSIS — W108XXA Fall (on) (from) other stairs and steps, initial encounter: Secondary | ICD-10-CM | POA: Insufficient documentation

## 2013-06-08 DIAGNOSIS — W19XXXA Unspecified fall, initial encounter: Secondary | ICD-10-CM

## 2013-06-08 DIAGNOSIS — Z8659 Personal history of other mental and behavioral disorders: Secondary | ICD-10-CM | POA: Insufficient documentation

## 2013-06-08 DIAGNOSIS — I209 Angina pectoris, unspecified: Secondary | ICD-10-CM | POA: Insufficient documentation

## 2013-06-08 DIAGNOSIS — H538 Other visual disturbances: Secondary | ICD-10-CM | POA: Insufficient documentation

## 2013-06-08 DIAGNOSIS — W010XXA Fall on same level from slipping, tripping and stumbling without subsequent striking against object, initial encounter: Secondary | ICD-10-CM | POA: Insufficient documentation

## 2013-06-08 NOTE — ED Notes (Signed)
Pt states that she was walking down the stairs and went to reach for the rail and missed it and fell down the stairs. Pt states that she hit her head at the bottom of the stairs and her knee as well

## 2013-06-08 NOTE — ED Provider Notes (Signed)
Medical screening examination/treatment/procedure(s) were performed by non-physician practitioner and as supervising physician I was immediately available for consultation/collaboration.   Kharter Brew, MD 06/08/13 0517 

## 2013-06-08 NOTE — ED Notes (Signed)
Pt. tripped and fell this evening at friend's house , no LOC / ambulatory . Reports pain at right knee and right side headache . Alert and oriented.

## 2013-06-08 NOTE — ED Provider Notes (Signed)
CSN: [id number]     Arrival date & time 06/08/13  0010 History   None    Chief Complaint  Patient presents with  . Fall   (Consider location/radiation/quality/duration/timing/severity/associated sxs/prior Treatment) HPI History provided by pt.   Pt presents w/ c/o fall.  Was walking down the stairs, developed room-spinning dizziness, grabbed for railing but missed, and then fell.  She is unsure of how she landed but there was impact on right side of her head.  No LOC.  Now w/ blurred vision and mild, persistent dizziness.  Has pain in right knee that is aggravated by ambulation as well.  Denies neck/back pain or any other injuries from fall.  Pt reports nearly constant, room-spinning dizziness that intermittently increases in severity for the past 2 years.  Was evaluated for this problem in the ED last month as well as two other occasions in the past and treated w/ meclizine for BPPV.  Was referred to neuro last month but has not yet followed up.  Past Medical History  Diagnosis Date  . DVT (deep vein thrombosis) in pregnancy   . Angina   . Shortness of breath   . Anxiety    Past Surgical History  Procedure Laterality Date  . Tubal ligation     No family history on file. History  Substance Use Topics  . Smoking status: Current Every Day Smoker -- 0.50 packs/day for 20 years  . Smokeless tobacco: Not on file  . Alcohol Use: Yes     Comment: ocasionally   OB History   Grav Para Term Preterm Abortions TAB SAB Ect Mult Living                 Review of Systems  All other systems reviewed and are negative.    Allergies  Percocet  Home Medications   Current Outpatient Rx  Name  Route  Sig  Dispense  Refill  . aspirin EC 81 MG tablet   Oral   Take 81 mg by mouth daily.         . meclizine (ANTIVERT) 25 MG tablet   Oral   Take 1 tablet (25 mg total) by mouth 3 (three) times daily as needed for dizziness.   30 tablet   0    BP 119/79  Pulse 75  Temp(Src) 98.1 F  (36.7 C) (Oral)  Resp 18  Wt 213 lb 1.6 oz (96.662 kg)  SpO2 98%  LMP 05/05/2013 Physical Exam  Nursing note and vitals reviewed. Constitutional: She is oriented to person, place, and time. She appears well-developed and well-nourished. No distress.  HENT:  Head: Normocephalic and atraumatic.  Eyes:  Normal appearance  Neck: Normal range of motion.  Cardiovascular: Normal rate, regular rhythm and intact distal pulses.   Pulmonary/Chest: Effort normal and breath sounds normal.  Musculoskeletal: Normal range of motion.  Entire spine non-tender.  No deformity or edema of right knee.  Tenderness over patellar tendon.  Full, active ROM w/ mild pain.  2+ DP pulse and distal sensation intact.    Neurological: She is alert and oriented to person, place, and time. No sensory deficit. Coordination normal.  CN 3-12 intact.  No nystagmus. 5/5 and equal upper and lower extremity strength.  No past pointing.  Nursing staff reports that patient ambulates w/ a nml gait.   Skin: Skin is warm and dry. No rash noted.  Psychiatric: She has a normal mood and affect. Her behavior is normal.    ED Course  Procedures (including critical care time) Labs Review Labs Reviewed - No data to display Imaging Review Ct Head Wo Contrast  06/08/2013   CLINICAL DATA:  The head trauma.  EXAM: CT HEAD WITHOUT CONTRAST  TECHNIQUE: Contiguous axial images were obtained from the base of the skull through the vertex without intravenous contrast.  COMPARISON:  CT of head June 07, 2011  FINDINGS: The ventricles and sulci are normal. No intraparenchymal hemorrhage, mass effect nor midline shift. No acute large vascular territory infarcts.  No abnormal extra-axial fluid collections. Basal cisterns are patent.  No skull fracture. 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 Visualized paranasal sinuses and mastoid air-cells are well-aerated. The  included ocular globes and orbital contents are non-suspicious.  IMPRESSION: No acute intracranial process. Normal noncontrast CT of the head, stable from June 07, 2011.   Electronically Signed   By: Awilda Metro   On: 06/08/2013 02:21    EKG Interpretation   None       MDM   1. Dizziness   2. Fall, initial encounter    47yo F presents w/ fall on stairs secondary to dizziness late last night.  Reports nearly constant dizziness x 2 years.  Has been evaluated for this problem in the ER on multiple occasions, had two negative cat scans of the head, sx attributed to BPPV, treated w/ meclizine and referred to neuro.  Has not scheduled f/u with neuro yet.  Dizzy spells have been more frequent recently but no more severe.  Sx this evening typical.  Hit head when she fell and has been experiencing headache and blurred vision, as well as persistent dizziness ever since. No visible head trauma and no focal neuro deficits on exam.  CT head negative.  Pt advised to f/u with neuro asap.  She mentioned occasional paresthesias of fingertips and I have some concern for MS.  Return precautions discussed.     Otilio Miu, PA-C 06/08/13 [phone]

## 2013-06-08 NOTE — ED Notes (Signed)
PA at bedside.

## 2013-06-08 NOTE — ED Notes (Signed)
Pt alert and oriented, with steady gait at time of discharge. Pt given discharge papers and papers explained. All questions answered and pt walked to discharge.  

## 2014-05-20 IMAGING — CT CT HEAD W/O CM
1 of 2 series · 16 of 30 positions shown, 20 images · non-contrast
Comparison: CT of head June 07, 2011

CLINICAL DATA: The head trauma.

EXAM:
CT HEAD WITHOUT CONTRAST
TECHNIQUE: Contiguous axial images were obtained from the base of the skull
through the vertex without intravenous contrast.

[Series 3: head 2.0 h70h · axial · 0.47mm/px · z∈[-134,+16]mm · 16 of 85 slices shown, 20 images]
[im 5/85  brain]
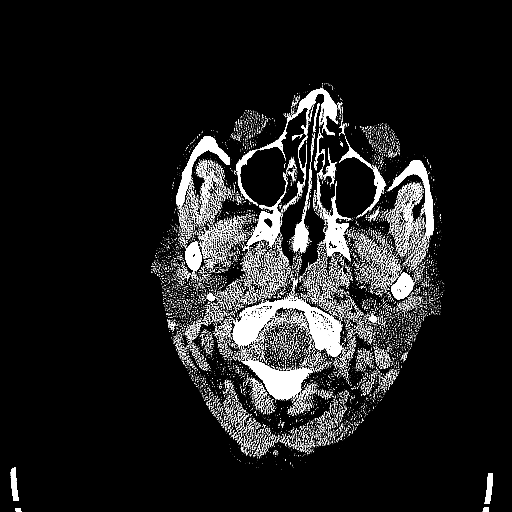
[im 5/85  bone]
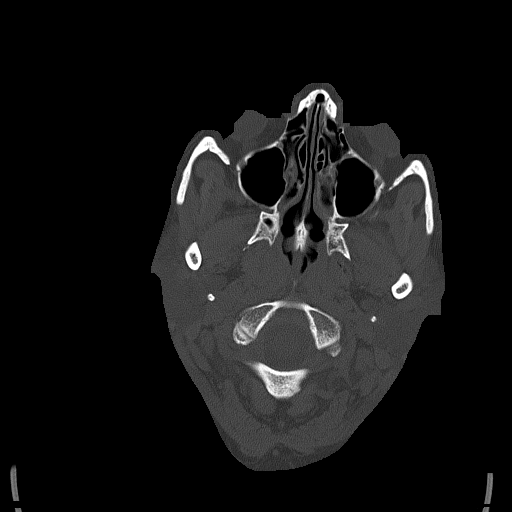
[im 9/85  brain]
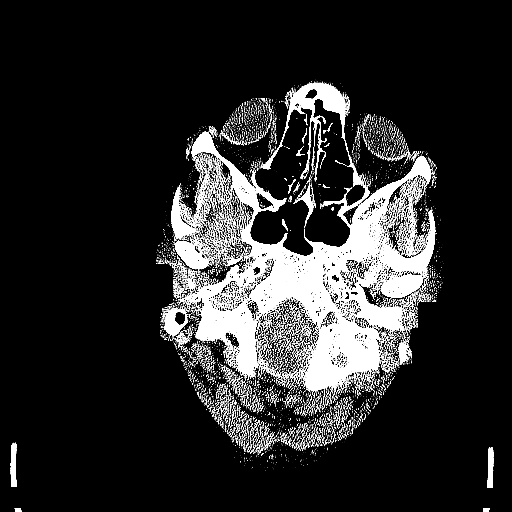
[im 13/85  brain]
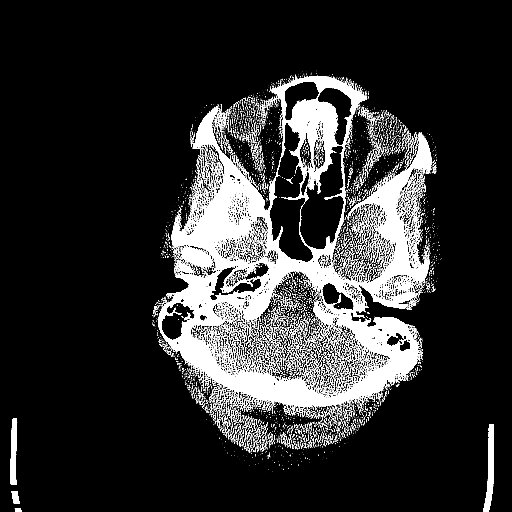
[im 22/85  brain]
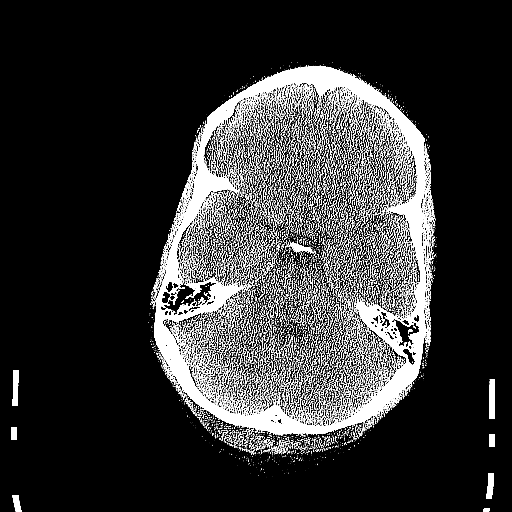
[im 26/85  brain]
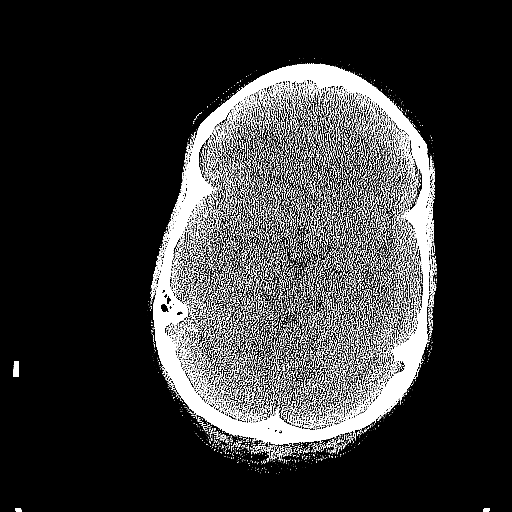
[im 26/85  bone]
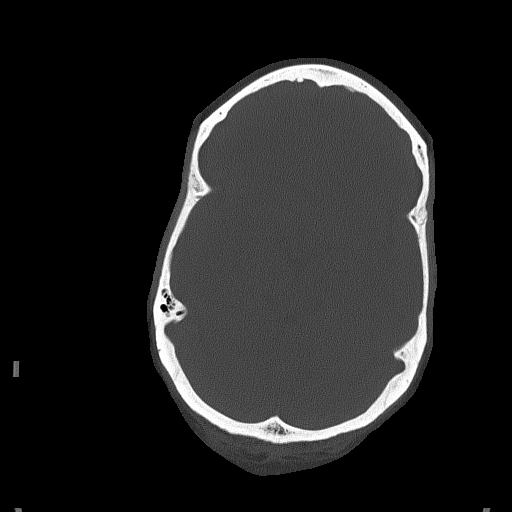
[im 30/85  brain]
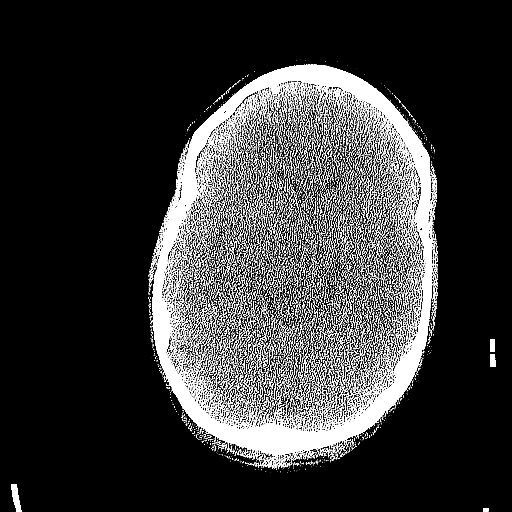
[im 34/85  brain]
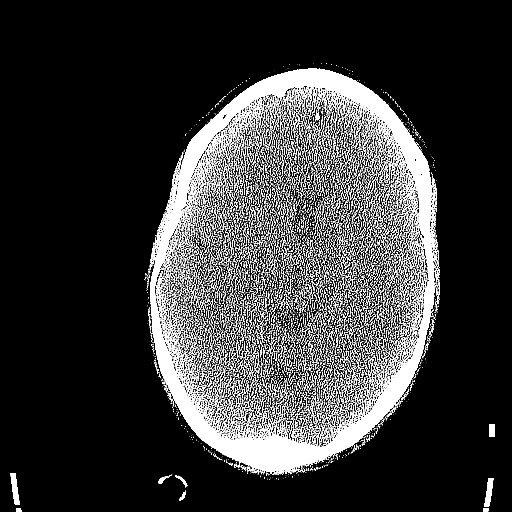
[im 38/85  brain]
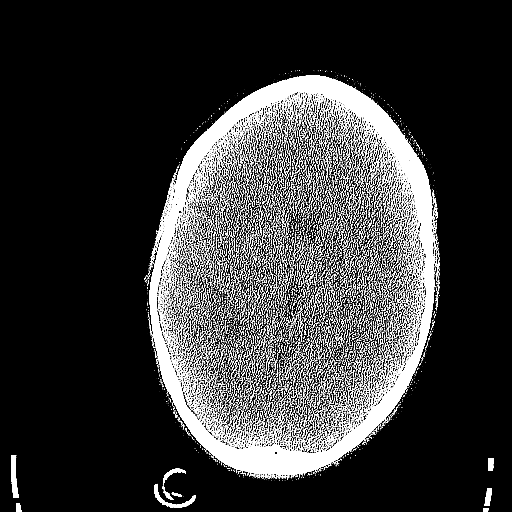
[im 47/85  brain]
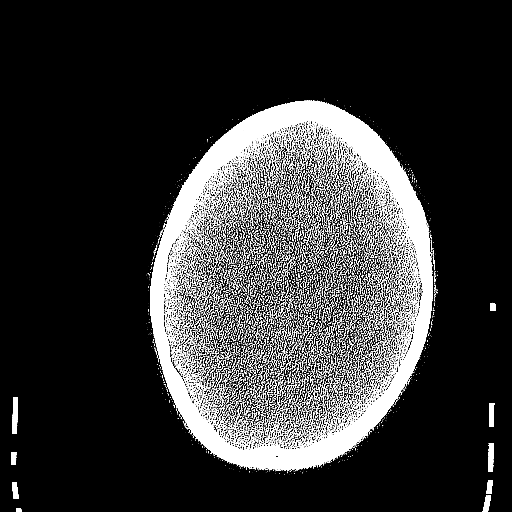
[im 47/85  bone]
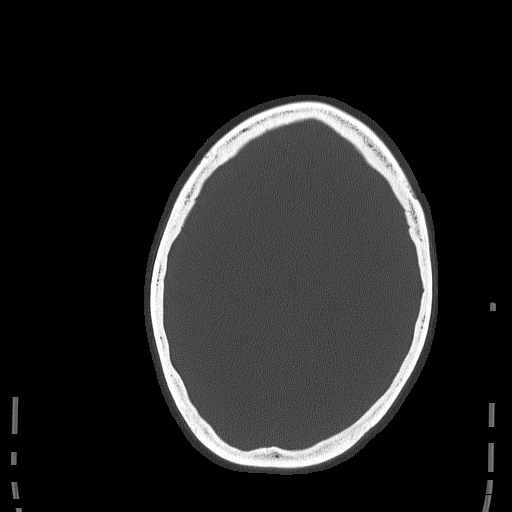
[im 51/85  brain]
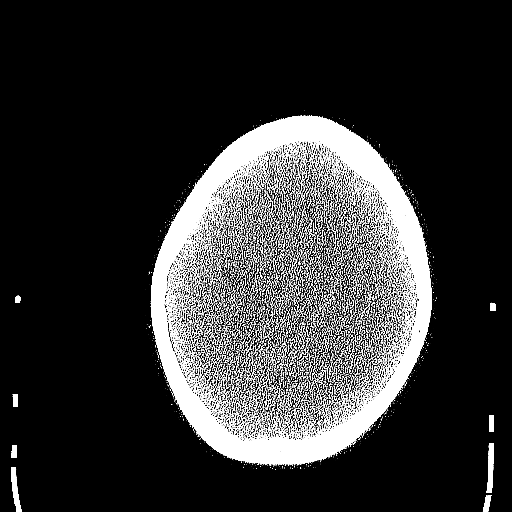
[im 55/85  brain]
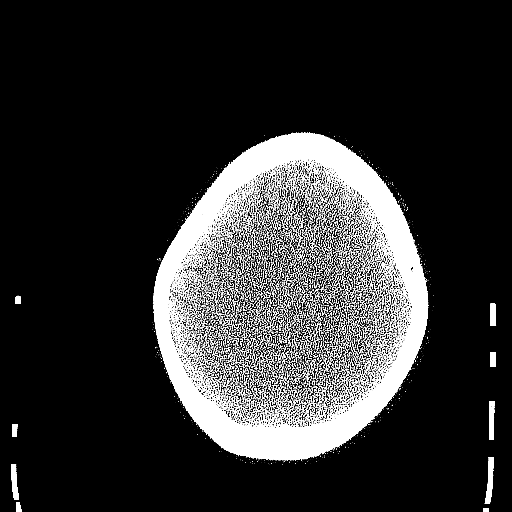
[im 59/85  brain]
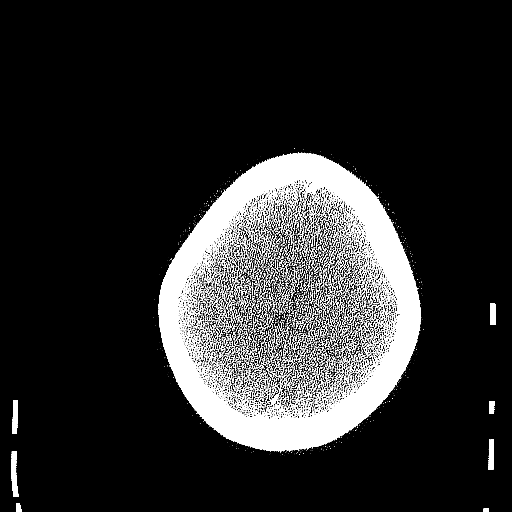
[im 64/85  brain]
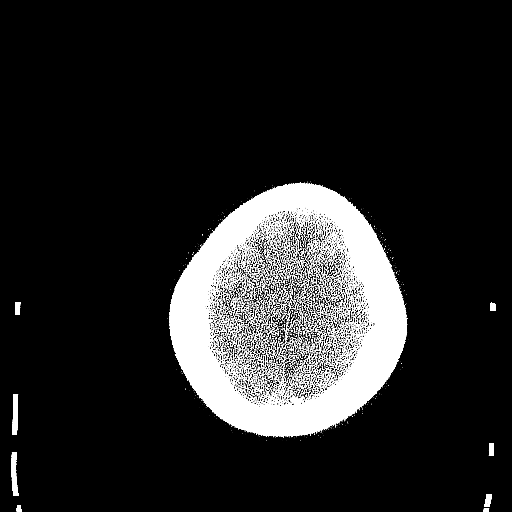
[im 64/85  bone]
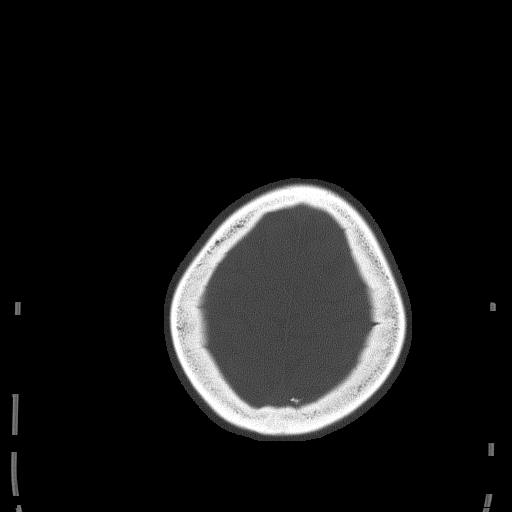
[im 72/85  brain]
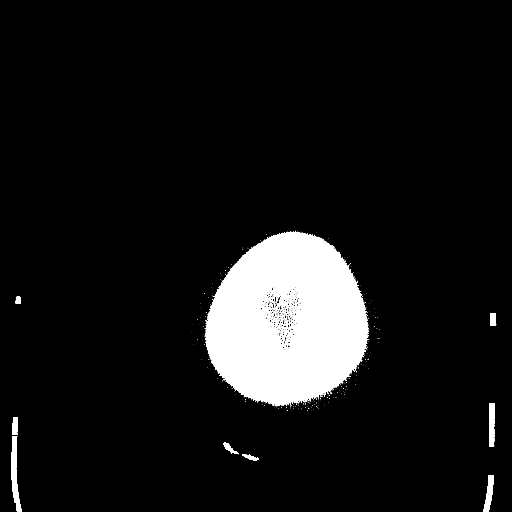
[im 76/85  brain]
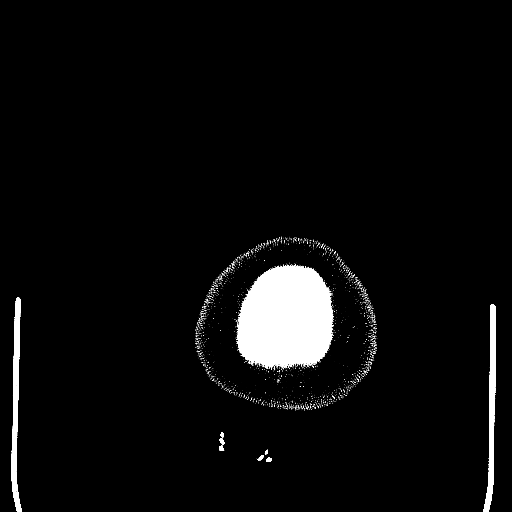
[im 80/85  brain]
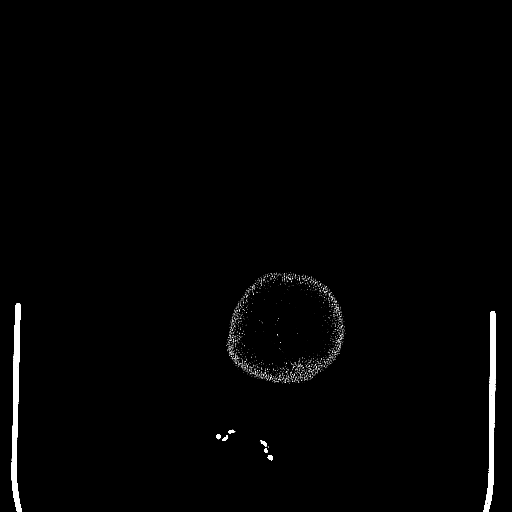

[16 of 30 positions shown; findings below may reference images not displayed]

FINDINGS: The ventricles and sulci are normal. No intraparenchymal hemorrhage,
mass effect nor midline shift. No acute large vascular territory
infarcts.

No abnormal extra-axial fluid collections. Basal cisterns are
patent.

No skull fracture. Tiny bony excrescence from the inner table of the
right frontal calvarium likely reflects hyperostosis frontalis
internus. Visualized paranasal sinuses and mastoid air-cells are
well-aerated. The included ocular globes and orbital contents are
non-suspicious.
IMPRESSION: No acute intracranial process. Normal noncontrast CT of the head,
stable from June 07, 2011.

  By: Odutayo Beshel

## 2014-07-26 ENCOUNTER — Encounter (HOSPITAL_COMMUNITY): Payer: Self-pay | Admitting: Emergency Medicine

## 2014-07-26 ENCOUNTER — Emergency Department (INDEPENDENT_AMBULATORY_CARE_PROVIDER_SITE_OTHER)
Admission: EM | Admit: 2014-07-26 | Discharge: 2014-07-26 | Disposition: A | Discharge status: Home / Self Care | Payer: Self-pay | Source: Home / Self Care | Attending: Emergency Medicine | Admitting: Emergency Medicine

## 2014-07-26 DIAGNOSIS — J019 Acute sinusitis, unspecified: Secondary | ICD-10-CM

## 2014-07-26 DIAGNOSIS — J209 Acute bronchitis, unspecified: Secondary | ICD-10-CM

## 2014-07-26 DIAGNOSIS — J039 Acute tonsillitis, unspecified: Secondary | ICD-10-CM

## 2014-07-26 LAB — POCT RAPID STREP A: Streptococcus, Group A Screen (Direct): NEGATIVE

## 2014-07-26 MED ORDER — AMOXICILLIN 500 MG PO CAPS: 500.0000 mg | ORAL_CAPSULE | Freq: Three times a day (TID) | ORAL | Status: AC

## 2014-07-26 MED ORDER — BENZONATATE 200 MG PO CAPS: 200.0000 mg | ORAL_CAPSULE | Freq: Three times a day (TID) | ORAL | Status: AC | PRN

## 2014-07-26 MED ORDER — PREDNISONE 20 MG PO TABS: 20.0000 mg | ORAL_TABLET | Freq: Two times a day (BID) | ORAL | Status: AC

## 2014-07-26 MED ORDER — ALBUTEROL SULFATE HFA 108 (90 BASE) MCG/ACT IN AERS: 2.0000 | INHALATION_SPRAY | Freq: Four times a day (QID) | RESPIRATORY_TRACT | Status: DC

## 2014-07-26 MED ORDER — ALBUTEROL SULFATE HFA 108 (90 BASE) MCG/ACT IN AERS
INHALATION_SPRAY | RESPIRATORY_TRACT | Status: AC
  Filled 2014-07-26: qty 6.7

## 2014-07-26 NOTE — ED Notes (Signed)
C/o  Cold symptoms since the last week of november until now.    C/o  Productive cough with yellow sputum.  Post nasal drip.  Nausea, off/on.  Low grade temp off/on.   Denies vomiting and diarrhea.   No relief with otc meds.

## 2014-07-26 NOTE — ED Provider Notes (Signed)
Chief Complaint   URI   History of Present Illness   Cynthia Carr is a homeless 49 year old female who lives at [facility]. For the past month and a half she's had upper respiratory symptoms which seem to get better and worse. She describes nasal congestion, rhinorrhea which is clear to yellow in color and sometimes bloody, watery eyes, cough productive of clear yellow sputum, wheezing, intermittent fevers up to 100, chills, and aches. Her last episode of fever was about 2 weeks ago. She vomited once. She's also had some sore throat.  Review of Systems   Other than as noted above, the patient denies any of the following symptoms: Systemic:  No fevers, chills, sweats, or myalgias. Eye:  No redness or discharge. ENT:  No ear pain, headache, nasal congestion, drainage, sinus pressure, or sore throat. Neck:  No neck pain, stiffness, or swollen glands. Lungs:  No cough, sputum production, hemoptysis, wheezing, chest tightness, shortness of breath or chest pain. GI:  No abdominal pain, nausea, vomiting or diarrhea.  PMFSH   Past medical history, family history, social history, meds, and allergies were reviewed. She is intolerant to Percocet. She has a history of DVT during pregnancy. She takes aspirin.  Physical exam   Vital signs:  BP 119/77 mmHg  Pulse 84  Temp(Src) 97.4 F (36.3 C) (Oral)  Resp 18  SpO2 100% General:  Alert and oriented.  In no distress.  Skin warm and dry. Eye:  No conjunctival injection or drainage. Lids were normal. ENT:  TMs and canals were normal, without erythema or inflammation.  Nasal mucosa was clear and uncongested, without drainage.  Mucous membranes were moist.  Tonsils were enlarged and red with spots of white exudate.  There were no oral ulcerations or lesions. Neck:  Supple, no adenopathy, tenderness or mass. Lungs:  No respiratory distress.  Lungs were clear to auscultation, without wheezes, rales or rhonchi.  Breath sounds were clear and equal  bilaterally.  Heart:  Regular rhythm, without gallops, murmers or rubs. Skin:  Clear, warm, and dry, without rash or lesions.  Labs   Results for orders placed or performed during the hospital encounter of 07/26/14  POCT rapid strep A [facility])  Result Value Ref Range   Streptococcus, Group A Screen (Direct) NEGATIVE NEGATIVE    Course in Urgent Care Center   The following medications were given:  Medications  albuterol (PROVENTIL HFA;VENTOLIN HFA) 108 (90 BASE) MCG/ACT inhaler 2 puff    Assessment     The primary encounter diagnosis was Acute bronchitis, unspecified organism. Diagnoses of Acute sinusitis, recurrence not specified, unspecified location and Tonsillitis were also pertinent to this visit.  Plan    1.  Meds:  The following meds were prescribed:   Discharge Medication List as of 07/26/2014 12:35 PM    START taking these medications   Details  amoxicillin (AMOXIL) 500 MG capsule Take 1 capsule (500 mg total) by mouth 3 (three) times daily., Starting 07/26/2014, Until Discontinued, Print    benzonatate (TESSALON) 200 MG capsule Take 1 capsule (200 mg total) by mouth 3 (three) times daily as needed for cough., Starting 07/26/2014, Until Discontinued, Print    predniSONE (DELTASONE) 20 MG tablet Take 1 tablet (20 mg total) by mouth 2 (two) times daily., Starting 07/26/2014, Until Discontinued, Print        2.  Patient Education/Counseling:  The patient was given appropriate handouts, self care instructions, and instructed in symptomatic relief.  Instructed to get extra  fluids and extra rest.    3.  Follow up:  The patient was told to follow up here if no better in 3 to 4 days, or sooner if becoming worse in any way, and given some red flag symptoms such as increasing fever, difficulty breathing, chest pain, or persistent vomiting which would prompt immediate return.       Reuben Likesavid C Cheryllynn Sarff, MD 07/26/14 1452

## 2014-07-26 NOTE — Discharge Instructions (Signed)
Most upper respiratory infections are caused by viruses and do not require antibiotics.  We try to save the antibiotics for when we really need them to prevent bacteria from developing resistance to them.  Here are a few hints about things that can be done at home to help get over an upper respiratory infection quicker: ° °Get extra sleep and extra fluids.  Get 7 to 9 hours of sleep per night and 6 to 8 glasses of water a day.  Getting extra sleep keeps the immune system from getting run down.  Most people with an upper respiratory infection are a little dehydrated.  The extra fluids also keep the secretions liquified and easier to deal with.  Also, get extra vitamin C.  4000 mg per day is the recommended dose. °For the aches, headache, and fever, acetaminophen or ibuprofen are helpful.  These can be alternated every 4 hours.  People with liver disease should avoid large amounts of acetaminophen, and people with ulcer disease, gastroesophageal reflux, gastritis, congestive heart failure, chronic kidney disease, coronary artery disease and the elderly should avoid ibuprofen. °For nasal congestion try Mucinex-D, or if you're having lots of sneezing or clear nasal drainage use Zyrtec-D. People with high blood pressure can take these if their blood pressure is controlled, if not, it's best to avoid the forms with a "D" (decongestants).  You can use the plain Mucinex, Allegra, Claritin, or Zyrtec even if your blood pressure is not controlled.   °A Saline nasal spray such as Ocean Spray can also help.  You can add a decongestant sprays such as Afrin, but you should not use the decongestant sprays for more than 3 or 4 days since they can be habituating.  Breathe Rite nasal strips can also offer a non-drug alternative treatment to nasal congestion, especially at night. °For people with symptoms of sinusitis, sleeping with your head elevated can be helpful.  For sinus pain, moist, hot compresses to the face may provide some  relief.  Many people find that inhaling steam as in a shower or from a pot of steaming water can help. °For any viral infection, zinc containing lozenges such as Cold-Eze or Zicam are helpful.  Zinc helps to fight viral infection.  Hot salt water gargles (8 oz of hot water, 1/2 tsp of table salt, and a pinch of baking soda) can give relief as well as hot beverages such as hot tea.  Sucrets extra strength lozenges will help the sore throat.  °For the cough, take Delsym 2 tsp every 12 hours.  It has also been found recently that Aleve can help control a cough.  The dose is 1 to 2 tablets twice daily with food.  This can be combined with Delsym. (Note, if you are taking ibuprofen, you should not take Aleve as well--take one or the other.) °A cool mist vaporizer will help keep your mucous membranes from drying out.  ° °It's important when you have an upper respiratory infection not to pass the infection to others.  This involves being very careful about the following: ° °Frequent hand washing or use of hand sanitizer, especially after coughing, sneezing, blowing your nose or touching your face, nose or eyes. °Do not shake hands or touch anyone and try to avoid touching surfaces that other people use such as doorknobs, shopping carts, telephones and computer keyboards. °Use tissues and dispose of them properly in a garbage can or ziplock bag. °Cough into your sleeve. °Do not let others eat or   drink after you. ° °It's also important to recognize the signs of serious illness and get evaluated if they occur: °Any respiratory infection that lasts more than 7 to 10 days.  Yellow nasal drainage and sputum are not reliable indicators of a bacterial infection, but if they last for more than 1 week, see your doctor. °Fever and sore throat can indicate strep. °Fever and cough can indicate influenza or pneumonia. °Any kind of severe symptom such as difficulty breathing, intractable vomiting, or severe pain should prompt you to see  a doctor as soon as possible. ° ° °Your body's immune system is really the thing that will get rid of this infection.  Your immune system is comprised of 2 types of specialized cells called T cells and B cells.  T cells coordinate the array of cells in your body that engulf invading bacteria or viruses while B cells orchestrate the production of antibodies that neutralize infection.  Anything we do or any medications we give you, will just strengthen your immune system or help it clear up the infection quicker.  Here are a few helpful hints to improve your immune system to help overcome this illness or to prevent future infections: °· A few vitamins can improve the health of your immune system.  That's why your diet should include plenty of fruits, vegetables, fish, nuts, and whole grains. °· Vitamin A and bet-carotene can increase the cells that fight infections (T cells and B cells).  Vitamin A is abundant in dark greens and orange vegetables such as spinach, greens, sweet potatoes, and carrots. °· Vitamin B6 contributes to the maturation of white blood cells, the cells that fight disease.  Foods with vitamin B6 include cold cereal and bananas. °· Vitamin C is credited with preventing colds because it increases white blood cells and also prevents cellular damage.  Citrus fruits, peaches and green and red bell peppers are all hight in vitamin C. °· Vitamin E is an anti-oxidant that encourages the production of natural killer cells which reject foreign invaders and B cells that produce antibodies.  Foods high in vitamin E include wheat germ, nuts and seeds. °· Foods high in omega-3 fatty acids found in foods like salmon, tuna and mackerel boost your immune system and help cells to engulf and absorb germs. °· Probiotics are good bacteria that increase your T cells.  These can be found in yogurt and are available in supplements such as Culturelle or Align. °· Moderate exercise increases the strength of your immune  system and your ability to recover from illness.  I suggest 3 to 5 moderate intensity 30 minute workouts per week.   °· Sleep is another component of maintaining a strong immune system.  It enables your body to recuperate from the day's activities, stress and work.  My recommendation is to get between 7 and 9 hours of sleep per night. °· If you smoke, try to quit completely or at least cut down.  Drink alcohol only in moderation if at all.  No more than 2 drinks daily for men or 1 for women. °· Get a flu vaccine early in the fall or if you have not gotten one yet, once this illness has run its course.  If you are over 65, a smoker, or an asthmatic, get a pneumococcal vaccine. °· My final recommendation is to maintain a healthy weight.  Excess weight can impair the immune system by interfering with the way the immune system deals with invading viruses or   bacteria. ° ° °How to Use an Inhaler °Proper inhaler technique is very important. Good technique ensures that the medicine reaches the lungs. Poor technique results in depositing the medicine on the tongue and back of the throat rather than in the airways. If you do not use the inhaler with good technique, the medicine will not help you. °STEPS TO FOLLOW IF USING AN INHALER WITHOUT AN EXTENSION TUBE °13. Remove the cap from the inhaler. °14. If you are using the inhaler for the first time, you will need to prime it. Shake the inhaler for 5 seconds and release four puffs into the air, away from your face. Ask your health care provider or pharmacist if you have questions about priming your inhaler. °15. Shake the inhaler for 5 seconds before each breath in (inhalation). °16. Position the inhaler so that the top of the canister faces up. °17. Put your index finger on the top of the medicine canister. Your thumb supports the bottom of the inhaler. °18. Open your mouth. °19. Either place the inhaler between your teeth and place your lips tightly around the mouthpiece, or  hold the inhaler 1-2 inches away from your open mouth. If you are unsure of which technique to use, ask your health care provider. °20. Breathe out (exhale) normally and as completely as possible. °21. Press the canister down with your index finger to release the medicine. °22. At the same time as the canister is pressed, inhale deeply and slowly until your lungs are completely filled. This should take 4-6 seconds. Keep your tongue down. °23. Hold the medicine in your lungs for 5-10 seconds (10 seconds is best). This helps the medicine get into the small airways of your lungs. °24. Breathe out slowly, through pursed lips. Whistling is an example of pursed lips. °25. Wait at least 15-30 seconds between puffs. Continue with the above steps until you have taken the number of puffs your health care provider has ordered. Do not use the inhaler more than your health care provider tells you. °26. Replace the cap on the inhaler. °27. Follow the directions from your health care provider or the inhaler insert for cleaning the inhaler. °STEPS TO FOLLOW IF USING AN INHALER WITH AN EXTENSION (SPACER) °1. Remove the cap from the inhaler. °2. If you are using the inhaler for the first time, you will need to prime it. Shake the inhaler for 5 seconds and release four puffs into the air, away from your face. Ask your health care provider or pharmacist if you have questions about priming your inhaler. °3. Shake the inhaler for 5 seconds before each breath in (inhalation). °4. Place the open end of the spacer onto the mouthpiece of the inhaler. °5. Position the inhaler so that the top of the canister faces up and the spacer mouthpiece faces you. °6. Put your index finger on the top of the medicine canister. Your thumb supports the bottom of the inhaler and the spacer. °7. Breathe out (exhale) normally and as completely as possible. °8. Immediately after exhaling, place the spacer between your teeth and into your mouth. Close your lips  tightly around the spacer. °9. Press the canister down with your index finger to release the medicine. °10. At the same time as the canister is pressed, inhale deeply and slowly until your lungs are completely filled. This should take 4-6 seconds. Keep your tongue down and out of the way. °11. Hold the medicine in your lungs for 5-10 seconds (10 seconds is   This helps the medicine get into the small airways of your lungs. Exhale. 12. Repeat inhaling deeply through the spacer mouthpiece. Again hold that breath for up to 10 seconds (10 seconds is best). Exhale slowly. If it is difficult to take this second deep breath through the spacer, breathe normally several times through the spacer. Remove the spacer from your mouth. 13. Wait at least 15-30 seconds between puffs. Continue with the above steps until you have taken the number of puffs your health care provider has ordered. Do not use the inhaler more than your health care provider tells you. 14. Remove the spacer from the inhaler, and place the cap on the inhaler. 15. Follow the directions from your health care provider or the inhaler insert for cleaning the inhaler and spacer. If you are using different kinds of inhalers, use your quick relief medicine to open the airways 10-15 minutes before using a steroid if instructed to do so by your health care provider. If you are unsure which inhalers to use and the order of using them, ask your health care provider, nurse, or respiratory therapist. If you are using a steroid inhaler, always rinse your mouth with water after your last puff, then gargle and spit out the water. Do not swallow the water. AVOID:  Inhaling before or after starting the spray of medicine. It takes practice to coordinate your breathing with triggering the spray.  Inhaling through the nose (rather than the mouth) when triggering the spray. HOW TO DETERMINE IF YOUR INHALER IS FULL OR NEARLY EMPTY You cannot know when an inhaler is  empty by shaking it. A few inhalers are now being made with dose counters. Ask your health care provider for a prescription that has a dose counter if you feel you need that extra help. If your inhaler does not have a counter, ask your health care provider to help you determine the date you need to refill your inhaler. Write the refill date on a calendar or your inhaler canister. Refill your inhaler 7-10 days before it runs out. Be sure to keep an adequate supply of medicine. This includes making sure it is not expired, and that you have a spare inhaler.  SEEK MEDICAL CARE IF:   Your symptoms are only partially relieved with your inhaler.  You are having trouble using your inhaler.  You have some increase in phlegm. SEEK IMMEDIATE MEDICAL CARE IF:   You feel little or no relief with your inhalers. You are still wheezing and are feeling shortness of breath or tightness in your chest or both.  You have dizziness, headaches, or a fast heart rate.  You have chills, fever, or night sweats.  You have a noticeable increase in phlegm production, or there is blood in the phlegm. MAKE SURE YOU:   Understand these instructions.  Will watch your condition.  Will get help right away if you are not doing well or get worse. Document Released: 07/03/2000 Document Revised: 04/26/2013 Document Reviewed: 02/02/2013 [facility] Patient Information 2015 [employer], [place]. This information is not intended to replace advice given to you by your health care provider. Make sure you discuss any questions you have with your health care provider.

## 2014-07-28 LAB — CULTURE, GROUP A STREP
